# Patient Record
Sex: Female | Born: 1980 | Race: White | Hispanic: No | Marital: Married | Smoking: Never smoker
Health system: Southern US, Community
[De-identification: ages and names within clinical notes are randomized; demographics above are authoritative.]

## PROBLEM LIST (undated history)

## (undated) DIAGNOSIS — F419 Anxiety disorder, unspecified: Secondary | ICD-10-CM

## (undated) DIAGNOSIS — M069 Rheumatoid arthritis, unspecified: Secondary | ICD-10-CM

## (undated) DIAGNOSIS — R519 Headache, unspecified: Secondary | ICD-10-CM

## (undated) DIAGNOSIS — I10 Essential (primary) hypertension: Secondary | ICD-10-CM

## (undated) DIAGNOSIS — E041 Nontoxic single thyroid nodule: Secondary | ICD-10-CM

## (undated) HISTORY — PX: ABDOMINAL HYSTERECTOMY: SHX81

---

## 2001-07-11 HISTORY — PX: ADENOIDECTOMY: SUR15

## 2002-12-06 ENCOUNTER — Emergency Department (HOSPITAL_COMMUNITY): Admission: EM | Admit: 2002-12-06 | Discharge: 2002-12-06 | Payer: Self-pay | Admitting: Emergency Medicine

## 2002-12-06 ENCOUNTER — Encounter: Payer: Self-pay | Admitting: Emergency Medicine

## 2003-09-22 ENCOUNTER — Observation Stay (HOSPITAL_COMMUNITY): Admission: AD | Admit: 2003-09-22 | Discharge: 2003-09-22 | Payer: Self-pay | Admitting: Obstetrics and Gynecology

## 2003-09-27 ENCOUNTER — Inpatient Hospital Stay (HOSPITAL_COMMUNITY): Admission: AD | Admit: 2003-09-27 | Discharge: 2003-09-27 | Payer: Self-pay | Admitting: Obstetrics and Gynecology

## 2003-10-01 ENCOUNTER — Inpatient Hospital Stay (HOSPITAL_COMMUNITY): Admission: AD | Admit: 2003-10-01 | Discharge: 2003-10-01 | Payer: Self-pay | Admitting: Obstetrics and Gynecology

## 2003-10-08 ENCOUNTER — Inpatient Hospital Stay (HOSPITAL_COMMUNITY): Admission: AD | Admit: 2003-10-08 | Discharge: 2003-10-11 | Payer: Self-pay | Admitting: Obstetrics and Gynecology

## 2003-11-12 ENCOUNTER — Other Ambulatory Visit: Admission: RE | Admit: 2003-11-12 | Discharge: 2003-11-12 | Payer: Self-pay | Admitting: Obstetrics and Gynecology

## 2005-02-18 ENCOUNTER — Other Ambulatory Visit: Admission: RE | Admit: 2005-02-18 | Discharge: 2005-02-18 | Payer: Self-pay | Admitting: Obstetrics and Gynecology

## 2005-08-25 ENCOUNTER — Inpatient Hospital Stay (HOSPITAL_COMMUNITY): Admission: AD | Admit: 2005-08-25 | Discharge: 2005-08-25 | Payer: Self-pay | Admitting: Obstetrics and Gynecology

## 2005-10-10 ENCOUNTER — Inpatient Hospital Stay (HOSPITAL_COMMUNITY): Admission: AD | Admit: 2005-10-10 | Discharge: 2005-10-13 | Payer: Self-pay | Admitting: Obstetrics & Gynecology

## 2008-08-15 ENCOUNTER — Inpatient Hospital Stay (HOSPITAL_COMMUNITY): Admission: AD | Admit: 2008-08-15 | Discharge: 2008-08-15 | Payer: Self-pay | Admitting: Obstetrics and Gynecology

## 2008-08-21 ENCOUNTER — Ambulatory Visit: Payer: Self-pay | Admitting: Internal Medicine

## 2008-11-12 ENCOUNTER — Inpatient Hospital Stay (HOSPITAL_COMMUNITY): Admission: AD | Admit: 2008-11-12 | Discharge: 2008-11-12 | Payer: Self-pay | Admitting: Obstetrics and Gynecology

## 2008-11-12 ENCOUNTER — Inpatient Hospital Stay (HOSPITAL_COMMUNITY): Admission: AD | Admit: 2008-11-12 | Discharge: 2008-11-15 | Payer: Self-pay | Admitting: Obstetrics and Gynecology

## 2008-11-20 ENCOUNTER — Inpatient Hospital Stay (HOSPITAL_COMMUNITY): Admission: AD | Admit: 2008-11-20 | Discharge: 2008-11-20 | Payer: Self-pay | Admitting: Obstetrics and Gynecology

## 2008-11-25 LAB — CONVERTED CEMR LAB: Pap Smear: NORMAL

## 2009-10-01 ENCOUNTER — Ambulatory Visit: Payer: Self-pay | Admitting: Internal Medicine

## 2009-10-01 DIAGNOSIS — J36 Peritonsillar abscess: Secondary | ICD-10-CM | POA: Insufficient documentation

## 2009-10-01 DIAGNOSIS — Z8659 Personal history of other mental and behavioral disorders: Secondary | ICD-10-CM | POA: Insufficient documentation

## 2009-10-01 DIAGNOSIS — I10 Essential (primary) hypertension: Secondary | ICD-10-CM | POA: Insufficient documentation

## 2009-10-19 ENCOUNTER — Encounter: Payer: Self-pay | Admitting: Internal Medicine

## 2009-10-29 ENCOUNTER — Encounter: Payer: Self-pay | Admitting: Internal Medicine

## 2009-11-12 ENCOUNTER — Encounter: Payer: Self-pay | Admitting: Internal Medicine

## 2010-01-20 ENCOUNTER — Encounter: Payer: Self-pay | Admitting: Internal Medicine

## 2010-01-20 ENCOUNTER — Ambulatory Visit: Payer: Self-pay | Admitting: Family

## 2010-01-20 ENCOUNTER — Telehealth: Payer: Self-pay | Admitting: Family

## 2010-01-20 DIAGNOSIS — F341 Dysthymic disorder: Secondary | ICD-10-CM | POA: Insufficient documentation

## 2010-01-20 DIAGNOSIS — B359 Dermatophytosis, unspecified: Secondary | ICD-10-CM | POA: Insufficient documentation

## 2010-01-20 LAB — CONVERTED CEMR LAB
ALT: 15 units/L (ref 0–35)
AST: 17 units/L (ref 0–37)
Albumin: 4.6 g/dL (ref 3.5–5.2)
Alkaline Phosphatase: 63 units/L (ref 39–117)
BUN: 12 mg/dL (ref 6–23)
Bilirubin, Direct: 0.1 mg/dL (ref 0.0–0.3)
CO2: 24 meq/L (ref 19–32)
Calcium: 9.6 mg/dL (ref 8.4–10.5)
Chloride: 103 meq/L (ref 96–112)
Creatinine, Ser: 0.78 mg/dL (ref 0.40–1.20)
Glucose, Bld: 78 mg/dL (ref 70–99)
Indirect Bilirubin: 0.3 mg/dL (ref 0.0–0.9)
Potassium: 4 meq/L (ref 3.5–5.3)
Sodium: 139 meq/L (ref 135–145)
Total Bilirubin: 0.4 mg/dL (ref 0.3–1.2)
Total Protein: 7.6 g/dL (ref 6.0–8.3)

## 2010-01-26 ENCOUNTER — Telehealth: Payer: Self-pay | Admitting: Internal Medicine

## 2010-02-03 ENCOUNTER — Telehealth: Payer: Self-pay | Admitting: Internal Medicine

## 2010-02-17 ENCOUNTER — Ambulatory Visit: Payer: Self-pay | Admitting: Family

## 2010-02-17 LAB — CONVERTED CEMR LAB
ALT: 15 units/L (ref 0–35)
AST: 18 units/L (ref 0–37)
Albumin: 4.8 g/dL (ref 3.5–5.2)
Alkaline Phosphatase: 57 units/L (ref 39–117)
Bilirubin, Direct: 0.1 mg/dL (ref 0.0–0.3)
Indirect Bilirubin: 0.2 mg/dL (ref 0.0–0.9)
Total Bilirubin: 0.3 mg/dL (ref 0.3–1.2)
Total Protein: 7.3 g/dL (ref 6.0–8.3)

## 2010-03-08 ENCOUNTER — Encounter: Payer: Self-pay | Admitting: Family

## 2010-03-08 DIAGNOSIS — L708 Other acne: Secondary | ICD-10-CM | POA: Insufficient documentation

## 2010-03-08 DIAGNOSIS — B360 Pityriasis versicolor: Secondary | ICD-10-CM | POA: Insufficient documentation

## 2010-05-11 ENCOUNTER — Telehealth: Payer: Self-pay | Admitting: Family

## 2010-05-24 ENCOUNTER — Encounter: Payer: Self-pay | Admitting: Internal Medicine

## 2010-05-24 LAB — CONVERTED CEMR LAB: Pap Smear: NORMAL

## 2010-05-26 ENCOUNTER — Ambulatory Visit: Payer: Self-pay | Admitting: Family

## 2010-05-26 ENCOUNTER — Telehealth: Payer: Self-pay | Admitting: Family

## 2010-05-26 DIAGNOSIS — G43009 Migraine without aura, not intractable, without status migrainosus: Secondary | ICD-10-CM | POA: Insufficient documentation

## 2010-06-08 ENCOUNTER — Telehealth: Payer: Self-pay | Admitting: Family

## 2010-08-10 NOTE — Progress Notes (Signed)
Summary: CAN SHE COVER THE SORE ON HER ARM   Phone Note Call from Patient   Caller: Patient Call For: D. Thomos Lemons DO Summary of Call: CAN SHE COVER THE SORE ON HER ARM OR DOES SHE NEED TO LEAVE IT OPEN TO THE AIR CALL HER @ [phone] Initial call taken by: Roselle Locus,  January 20, 2010 3:13 PM  Follow-up for Phone Call        Notified pt per verbal instruction from [facility] that it is ok to cover her arm.  Nicki Guadalajara Fergerson CMA Duncan Dull)  January 20, 2010 4:13 PM

## 2010-08-10 NOTE — Progress Notes (Signed)
Summary: refill--Pristiq  Phone Note Refill Request Message from:  Patient on May 11, 2010 3:39 PM  Refills Requested: Medication #1:  PRISTIQ 50 MG XR24H-TAB one tablet by mouth daily   Dosage confirmed as above?Dosage Confirmed   Supply Requested: 1 month   Last Refilled: 04/22/2010 Pt request rx be [facility]. Pt notified.  Next Appointment Scheduled: 05-19-10 O'sullivan,NP Initial call taken by: Mervin Kung CMA Duncan Dull),  May 11, 2010 3:40 PM    Prescriptions: PRISTIQ 50 MG XR24H-TAB (DESVENLAFAXINE SUCCINATE) one tablet by mouth daily  #30 x 0   Entered by:   Mervin Kung CMA (AAMA)   Authorized by:   Lemont Fillers FNP   Signed by:   Mervin Kung CMA (AAMA) on 05/11/2010   Method used:   [facility]  [address] [phone]       [address] [place], [place]  [address]       Ph: [phone] or [phone]       Fax: [phone]   RxID:   [id number]

## 2010-08-10 NOTE — Consult Note (Signed)
Summary: [facility]  [facility]   Imported By: Lanelle Bal 11/20/2009 11:24:56  _____________________________________________________________________  External Attachment:    Type:   Image     Comment:   External Document

## 2010-08-10 NOTE — Progress Notes (Signed)
Summary: Terbenafine refill, LFT  Phone Note Call from Patient Call back at [phone]   Caller: Patient Call For: D. Thomos Lemons DO Summary of Call: Pt states that her ringworm seems to be getting better but is not completely healed.  She also has a new place that has come up. She is out of Terbenafine and would like a refill. Please advise.  Nicki Guadalajara Fergerson CMA Duncan Dull)  February 03, 2010 11:07 AM   Follow-up for Phone Call        ok to refill x 1. I rec she come in for blood tests to monitor her LFTs Follow-up by: D. Thomos Lemons DO,  February 03, 2010 12:18 PM  Additional Follow-up for Phone Call Additional follow up Details #1::        Pt advised that refill was approved and sent to pharmacy.  Pt has appt for LFTs in September. Does she need to have LFTs  before that time?  Nicki Guadalajara Fergerson CMA Duncan Dull)  February 03, 2010 1:06 PM     Additional Follow-up for Phone Call Additional follow up Details #2::    yes, come in earlier.  within 1-2 weeks Follow-up by: D. Thomos Lemons DO,  February 04, 2010 1:44 PM  Additional Follow-up for Phone Call Additional follow up Details #3:: Details for Additional Follow-up Action Taken: Pt notified per Dr. Olegario Messier instructions.  Lab appt set for 02/17/10 and order faxed to lab.  Nicki Guadalajara Fergerson CMA Duncan Dull)  February 04, 2010 3:29 PM   Prescriptions: TERBINAFINE HCL 250 MG TABS (TERBINAFINE HCL) one tablet by mouth daily x 2 weeks  #14 x 0   Entered by:   Mervin Kung CMA (AAMA)   Authorized by:   D. Thomos Lemons DO   Signed by:   Mervin Kung CMA (AAMA) on 02/03/2010   Method used:   Electronically to        [facility]  [phone]* (retail)       [address]       [place], [place]  [address]       Ph: [phone] or [phone]       Fax: [phone]   RxID:   [id number]

## 2010-08-10 NOTE — Assessment & Plan Note (Signed)
Summary: migraine/mhf--Rm 4   Vital Signs:  Patient profile:   30 year old female Height:      63 inches Weight:      215 pounds BMI:     38.22 Temp:     98.3 degrees F oral Pulse rate:   90 / minute Pulse rhythm:   regular Resp:     18 per minute BP sitting:   150 / 80  (right arm) Cuff size:   large  Vitals Entered By: Mervin Kung CMA Duncan Dull) (May 26, 2010 9:56 AM) CC: Pt states she has noticed increase in migraines. Mostly around menstrual cycle., Headache Is Patient Diabetic? No Pain Assessment Patient in pain? no      Comments Pt states she is still taking Pristiq and Xanax. Having to use Xanax more often due to "panic attacks".   Pt has stopped all other medications.  Nicki Guadalajara Fergerson CMA Duncan Dull)  May 26, 2010 10:06 AM    Primary Care Provider:  Dondra Spry DO  CC:  Pt states she has noticed increase in migraines. Mostly around menstrual cycle. and Headache.  History of Present Illness: Ms. Schrier is a 30 year old female who presents today for followup.  #1 depression/anxiety- initially her depression and anxiety were well controlled on her cecum Wellbutrin. The patient tells me that she stopped her Wellbutrin approximately 6 weeks ago. She has had an increase in her panic attacks since that time. She also tells me that she has a history of ADD, and finds it difficult to complete tasks at home. She does note that her panic attacks are relieved with use of Xanax however she uses this very sparingly. She has been followed by Dr. Betti Cruz in the past  #2 Headaches-the patient notes that she often develops headaches after her panic attacks. These headaches are accompanied by frontal discomfort. She notes that she'll also develops flashing lights and zigzags in her vision. The symptoms are relieved also by Xanax. She notes an increase in her headaches around the time of her ovulation and around time of her period. Ibuprofen does not help her headaches.  #3 blood  pressure-the patient tells me that she has been monitoring her blood pressure at home. She self discontinued the methyldopa. Reports blood pressure has been well-controlled and that she has "white coat hypertension."  Allergies: 1)  ! Amoxicillin  Past History:  Past Medical History: Last updated: 10/01/2009 Hypertension    Review of Systems       see HPI  Physical Exam  Head:  Normocephalic and atraumatic without obvious abnormalities. No apparent alopecia or balding. Eyes:  pupils equal round reactive to light and accommodation Lungs:  Normal respiratory effort, chest expands symmetrically. Lungs are clear to auscultation, no crackles or wheezes. Heart:  Normal rate and regular rhythm. S1 and S2 normal without gallop, murmur, click, rub or other extra sounds. Neurologic:  alert & oriented X3 and gait normal.   Psych:  pleasant but tearful female who is normally interactive with good eye contact   Impression & Recommendations:  Problem # 1:  DEPRESSION/ANXIETY (ICD-300.4) Assessment Deteriorated  Will refer patient to psychiatry for further evaluation of her anxiety depression and history of attention deficit disorder. I have instructed the patient to use Xanax as needed. She will be continued on Pritique and will defer further medication changes to psychiatry.  She wishes to see a new psychiatrist- previously followed by Dr. Betti Cruz.  25 minutes were spent with patient.  Greater than 50% of  this time was spent counseling patient on her anxiety and depression.    Orders: Psychiatric Referral (Psych)  Problem # 2:  HYPERTENSION (ICD-401.9) Assessment: Deteriorated Pt notes BP's have been stable at home.  Will have pt monitor and call us with the readings.  She is currently not taking methyldopa. The following medications were removed from the medication list:    Methyldopa 250 Mg Tabs (Methyldopa) ..... One by mouth once daily  Problem # 3:  MIGRAINE, COMMON  (ICD-346.10) Assessment: New  Trial of as needed imitrex.   Her updated medication list for this problem includes:    Sumatriptan Succinate 50 Mg Tabs (Sumatriptan succinate) ..... One tablet by mouth at start of headache.  may repeat in 2 hours if headache not relieved or if recurrent headache  Complete Medication List: 1)  Pristiq 50 Mg Xr24h-tab (Desvenlafaxine succinate) .... One tablet by mouth daily 2)  Dialyvite Vitamin D 5000 5000 Unit Caps (Cholecalciferol) .... Take one capsule by mouth every other day 3)  Xanax 0.5 Mg Tabs (Alprazolam) .... One tablet by mouth two times a day as needed 4)  Lotrisone 1-0.05 % Crea (Clotrimazole-betamethasone) .... Apply twice daily to affected areas 5)  Sumatriptan Succinate 50 Mg Tabs (Sumatriptan succinate) .... One tablet by mouth at start of headache.  may repeat in 2 hours if headache not relieved or if recurrent headache  Patient Instructions: 1)  You will be contacted about your referral to psychiatry.   Prescriptions: SUMATRIPTAN SUCCINATE 50 MG TABS (SUMATRIPTAN SUCCINATE) one tablet by mouth at start of headache.  May repeat in 2 hours if headache not relieved or if recurrent Headache  #6 x 0   Entered and Authorized by:   Lemont Fillers FNP   Signed by:   Lemont Fillers FNP on 05/26/2010   Method used:   [facility]  [address] [phone][facility] ([facility]ail[facility]       [address]       [place], [place]  [address]       Ph: [phone] or [phone]       Fax: [phone]   RxID:   [id number]    Orders Added: 1)  Psychiatric Referral [Psych] 2)  Est. Patient Level IV [[address]]    Current Allergies (reviewed today): ! AMOXICILLIN

## 2010-08-10 NOTE — Consult Note (Signed)
Summary: [facility]  [facility]   Imported By: Lanelle Bal 10/08/2009 13:18:53  _____________________________________________________________________  External Attachment:    Type:   Image     Comment:   External Document

## 2010-08-10 NOTE — Consult Note (Signed)
Summary: [facility]  [facility]   Imported By: Lanelle Bal 11/06/2009 09:30:54  _____________________________________________________________________  External Attachment:    Type:   Image     Comment:   External Document

## 2010-08-10 NOTE — Assessment & Plan Note (Signed)
Summary: 1 month follow up/mhf--Rm 4   Vital Signs:  Patient profile:   30 year old female Height:      63 inches Weight:      207.75 pounds BMI:     36.93 Temp:     98.4 degrees F oral Pulse rate:   84 / minute Pulse rhythm:   regular Resp:     16 per minute BP sitting:   120 / 68  (left arm) Cuff size:   large  Vitals Entered By: Mervin Kung CMA Duncan Dull) (February 17, 2010 2:49 PM) CC: rOOM 4   1 month follow up. Is Patient Diabetic? No Comments Pt has completed: magic mouthwas, cyanocobalamin, vit d, clindamycin and terbinafine.  Still has 1 week left on Lexapro 5mg  1 daily then will be completed. Eileen Foster CMA Duncan Dull)  February 17, 2010 2:55 PM    Primary Care Provider:  Dondra Spry DO  CC:  rOOM 4   1 month follow up.Marland Kitchen  History of Present Illness: Eileen Foster is a 30 year old female who presents today for follow up of her Depression.  She reports that he depression remains well controlled.  Last visit she was placed on Pristiq due to difficulty losing weight on Lexapro.  She continues the lexapro taper as instructed and notes that she feels well.  Denies Suicide ideation.    Ringworm-  resolved on forearm.  She has been on terbinafine x 1 month.  She has some additional lesions which have not improved despite terbinafine.     Allergies: 1)  ! 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 No apparent alopecia or balding. Lungs:  Normal respiratory effort, chest expands symmetrically. Lungs are clear to auscultation, no crackles or wheezes. Heart:  Normal rate and regular rhythm. S1 and S2 normal without gallop, murmur, click, rub or other extra sounds. Skin:  Resolution of lesion on right forearm.  Circular Lesion on right upper thigh remains unchanged.   Small circular esion on left anterior thigh, left breast.   Impression & Recommendations:  Problem # 1:  RINGWORM (ICD-110.9) Assessment Improved Large lesion has resolved, however smaller lesions remain unchanged.  Pt has been on terbinafine x 1 month.  Will D/C terbinafine and plan to refer patient to Dermatology for further evaluation.   Not clear if these lesions are ringworm or not. Patient will be given topical rx for lotrisone in the meantime.   Orders: Dermatology Referral (Derma)  Problem # 2:  DEPRESSION (ICD-311) Assessment: Improved Stable, continue Pristique, wellbutrin.   Her updated medication list for this problem includes:    Pristiq 50 Mg Xr24h-tab (Desvenlafaxine succinate) ..... One tablet by mouth daily    Wellbutrin Sr 150 Mg Xr12h-tab (Bupropion hcl) .Marland Kitchen... Take 1 tablet by mouth once a day    Xanax 0.25 Mg Tabs (Alprazolam) .Marland Kitchen... As needed for  anxiety  Complete Medication List: 1)  Pristiq 50 Mg Xr24h-tab (Desvenlafaxine succinate) .... One tablet by mouth daily 2)  Wellbutrin Sr 150 Mg Xr12h-tab (Bupropion hcl) .... Take 1 tablet by mouth once a day 3)  Dialyvite Vitamin D 5000 5000 Unit Caps (Cholecalciferol) .... Take one capsule by mouth every other day 4)  Methyldopa 250 Mg Tabs (Methyldopa) .... One by mouth once daily 5)  Xanax 0.25 Mg Tabs (Alprazolam) .... As needed for anxiety 6)  Lotrisone 1-0.05  % Crea (Clotrimazole-betamethasone) .... Apply twice daily to affected areas  Other Orders: TLB-Hepatic/Liver Function Pnl (80076-HEPATIC)  Patient Instructions: 1)  You will be contacted about your referral to dermatology. 2)  Complete your lab work downstairs. 3)  Please follow up with Dr. Artist Pais in 3 months. Prescriptions: LOTRISONE 1-0.05 % CREA (CLOTRIMAZOLE-BETAMETHASONE) apply twice daily to affected areas  #1 x 1   Entered and Authorized by:   Lemont Fillers FNP   Signed by:   Lemont Fillers FNP on 02/17/2010   Method used:   Electronically to        [facility]  [phone]* (retail)       [address]       [place], [place]  [address]       Ph: [phone] or [phone]       Fax: [phone]   RxID:   [id number]    Current Allergies (reviewed today): ! AMOXICILLIN

## 2010-08-10 NOTE — Progress Notes (Signed)
Summary: BP check  Phone Note Outgoing Call   Summary of Call: Please call patient and notify her that [facility] [place].  Also, please instruct her to check BP once daily for 1 week and contact us with the results.  Initial call taken by: Lemont Fillers FNP,  May 26, 2010 11:01 AM  Follow-up for Phone Call        Left message on machine to return my call. Nicki Guadalajara Fergerson CMA Duncan Dull)  May 26, 2010 12:00 PM   Additional Follow-up for Phone Call Additional follow up Details #1::        Pt advised and voices understanding. Nicki Guadalajara Fergerson CMA Duncan Dull)  May 26, 2010 12:09 PM

## 2010-08-10 NOTE — Progress Notes (Signed)
Summary: Follow up blood work  Phone Note [employer] of Call: Could you please arrange follow up lfts for this patient in 2 months? (v58.83) thanks Initial call taken by: Lemont Fillers FNP,  January 26, 2010 10:17 PM  Follow-up for Phone Call        attempted to contact patient at [phone], no answer. A detailed  voice message was left informing patient per [facility] instructions. She was advised to call back if any questions Follow-up by: Glendell Docker CMA,  January 27, 2010 9:30 AM

## 2010-08-10 NOTE — Consult Note (Signed)
Summary: [facility]  [facility]   Imported By: Lanelle Bal 03/12/2010 12:11:59  _____________________________________________________________________  External Attachment:    Type:   Image     Comment:   External Document

## 2010-08-10 NOTE — Consult Note (Signed)
Summary: [facility]  [facility]   Imported By: Lanelle Bal 10/28/2009 08:58:28  _____________________________________________________________________  External Attachment:    Type:   Image     Comment:   External Document

## 2010-08-10 NOTE — Miscellaneous (Signed)
  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

## 2010-08-10 NOTE — Assessment & Plan Note (Signed)
Summary: ringworm on arm/dt--Rm 3   Vital Signs:  Patient profile:   30 year old female Height:      63 inches Weight:      206.50 pounds BMI:     36.71 Temp:     97.9 degrees F oral Pulse rate:   84 / minute Pulse rhythm:   regular Resp:     16 per minute BP sitting:   120 / 68  (left arm) Cuff size:   large  Vitals Entered By: Mervin Kung CMA Duncan Dull) (January 20, 2010 2:30 PM) CC: Room 3  Pt states she has possible ringworm on right arm not relieved by OTC creams. Getting worse. Pt states Pristiq worked better in the past and would like to change from Lexapro to this. Is Patient Diabetic? No Comments Pt no longer taking:  Magic mouthwash, cyanocobalamin, vit d, clindamycin.     Primary Care Provider:  Dondra Spry DO  CC:  Room 3  Pt states she has possible ringworm on right arm not relieved by OTC creams. Getting worse. Pt states Pristiq worked better in the past and would like to change from Lexapro to this..  History of Present Illness: Ms Toothaker is a 30 year old female who presents today with two concerns:   1) Skin lesion- Pt reports  "ringworm" x 6 weeks on her right forearm.  She has tried lamisil otc with minimal relief.  Has also tried some home remedies without relief.  She now has another lesion on the top of her groin.  Denies itching.    2)Post partum Depression-  Patient has a 44 year old child (3rd child).  She did not have any issues with post partum depression following the birth of her first child.  After second child was born she was on the lexapro for one year prior to being switched to Pristiq.  She then took the Pristiq for 1 year.  She noted + panic attacks while on the Lexapro. less so on the pristiq.  Currently on Lexapro- still having panic attacks, about once a month. She is concerned about difficulty losing weight on Lexapro and the fact that she still continues to have panic attacks. She wishes to be switched to Pristiq.  Allergies: 1)  !  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 No apparent alopecia or balding. Skin:  2-3 inch wide annular lesion on the right forearm which is raised and erythematous, 1cm wide similar lesion at the top of her right leg.   Psych:  Cognition and judgment appear intact. Alert and cooperative with normal attention span and concentration. No apparent delusions, illusions, hallucinations   Impression & Recommendations:  Problem # 1:  DEPRESSION (ICD-311) Assessment Comment Only Will plan to start Pristiq and wean off of Lexapro as noted in pt instructions.  Pt instructed to call if she develops  worsening symptoms of depression on Pristiq (Go to ER if suicide ideation).   Her updated medication list for this problem includes:    Pristiq 50 Mg Xr24h-tab (Desvenlafaxine succinate) ..... One tablet by mouth daily    Wellbutrin Sr 150 Mg Xr12h-tab (Bupropion hcl) .Marland Kitchen... Take 1 tablet by mouth once a day    Xanax 0.25 Mg Tabs (Alprazolam) .Marland Kitchen... As needed for anxiety  Problem # 2:  RINGWORM (ICD-110.9) Assessment: New Has failed topical therapy.  Will start on by mouth Terbinafine. Check baseline LFT's/Cr Orders: TLB-BMP (Basic Metabolic  Panel-BMET) (80048-METABOL) TLB-Hepatic/Liver Function Pnl (80076-HEPATIC)  Complete Medication List: 1)  Magic Mouth Wash  .Marland Kitchen.. 10 cc swish and swallow or apit 2)  Pristiq 50 Mg Xr24h-tab (Desvenlafaxine succinate) .... One tablet by mouth daily 3)  Cyanocobalamin 1000 Mcg/ml Soln (Cyanocobalamin) .... Inject im once per month 4)  Wellbutrin Sr 150 Mg Xr12h-tab (Bupropion hcl) .... Take 1 tablet by mouth once a day 5)  Dialyvite Vitamin D 5000 5000 Unit Caps (Cholecalciferol) .... Take one capsule by mouth every other day 6)  Clindamycin Hcl 300 Mg Caps (Clindamycin hcl) .... One by mouth three times a day 7)  Methyldopa 250 Mg Tabs (Methyldopa) .... One by mouth once daily 8)  Xanax 0.25 Mg Tabs (Alprazolam) .... As needed for anxiety 9)  Terbinafine Hcl 250 Mg Tabs (Terbinafine hcl) .... One tablet by mouth daily x 2 weeks  Patient Instructions: 1)  Cut your lexapro in half 5mg  x 2 weeks. 2)  Then take lexapro 5mg  every other day for 2 weeks then stop. 3)  Please follow up with Dr. Artist Pais in 1 month- sooner if problems or concerns. Prescriptions: TERBINAFINE HCL 250 MG TABS (TERBINAFINE HCL) one tablet by mouth daily x 2 weeks  #14 x 0   Entered and Authorized by:   Lemont Fillers FNP   Signed by:   Lemont Fillers FNP on 01/20/2010   Method used:   Electronically to        [facility]  [phone]* (retail)       [address]       [place], [place]  [address]       Ph: [phone] or [phone]       Fax: [phone]   RxID:   [id number] PRISTIQ 50 MG XR24H-TAB (DESVENLAFAXINE SUCCINATE) one tablet by mouth daily  #30 x 1   Entered and Authorized by:   Lemont Fillers FNP   Signed by:   Lemont Fillers FNP on 01/20/2010   Method used:   Electronically to        [facility]  [phone]* (retail)       [address]       [place], [place]  [address]       Ph: [phone] or  [phone]       Fax: [phone]   RxID:   [id number]   Current Allergies (reviewed today): ! AMOXICILLIN

## 2010-08-10 NOTE — Progress Notes (Signed)
Summary: BP readings  ---- Converted from flag ---- ---- 06/08/2010 10:47 AM, Mervin Kung CMA (AAMA) wrote: Left message for pt to call me with BP readings.  ---- 06/07/2010 9:01 AM, Lemont Fillers FNP wrote: Please call patient to follow up on her blood pressure readings. ------------------------------  Phone Note Call from Patient Call back at [phone]   Caller: Patient Call For: Lemont Fillers FNP Summary of Call: Pt returned my call with BP readings. States they ranged from 120-130 / 70s. BP was 126/78 at OB last week. Nicki Guadalajara Fergerson CMA Duncan Dull)  June 08, 2010 2:35 PM   Follow-up for Phone Call        Good, she should follow up in 3 months please. Follow-up by: Lemont Fillers FNP,  June 09, 2010 8:16 AM  Additional Follow-up for Phone Call Additional follow up Details #1::        Pt scheduled f/u for 09/01/10 @ 3:30pm. Appt reminder has been mailed to pt. Nicki Guadalajara Fergerson CMA Duncan Dull)  June 09, 2010 9:05 AM

## 2010-08-10 NOTE — Assessment & Plan Note (Signed)
Summary: NEW SORE THROAT AND CONGESTION/MHF   Vital Signs:  Patient profile:   30 year old female Height:      63 inches Weight:      198 pounds BMI:     35.20 O2 Sat:      100 % Temp:     98.4 degrees F oral Pulse rate:   116 / minute Resp:     16 per minute BP sitting:   130 / 70  (right arm) Cuff size:   large  Vitals Entered By: Glendell Docker CMA (October 01, 2009 10:59 AM) CC: Rm 2- New Patient   Primary Care Provider:  Dondra Spry DO  CC:  Rm 2- New Patient.  History of Present Illness: 30 y/o white female to establish she c/o severe sore throat for past 2-3 days she was seen at urgent care and given rx for cephalexin  she notes episode of pharyngitis 2-3 wks ago.  she was given amoxicillin which caused stomach upset  no prev PCP she has been followed by her GYN BP issues during pregnancy.  she has been on labetalol in the past she has been able to gradually decrease dose of aldomet to current dose of 250 mg  she also has hx of post partum depression.    stable on lexapro and wellbutrin  Preventive Screening-Counseling & Management  Alcohol-Tobacco     Alcohol drinks/day: 0     Smoking Status: quit     Packs/Day: 0.5     Year Quit: 2004  Caffeine-Diet-Exercise     Caffeine use/day: 1 beverage daily     Does Patient Exercise: no  Allergies (verified): 1)  ! Amoxicillin  Past History:  Past Medical History: Hypertension    Family History: Family History of Arthritis Family History of CAD Female 1st degree relative <60 Family History Diabetes 1st degree relative Family History Hypertension    Social History: Occupation: Stay at [employer] Married 7 years  1 daughter 5 2 son 3, 1Smoking Status:  quit Packs/Day:  0.5 Caffeine use/day:  1 beverage daily Does Patient Exercise:  no  Review of Systems       The patient complains of weight gain.  The patient denies fever, chest pain, dyspnea on exertion, abdominal pain, melena, hematochezia,  severe indigestion/heartburn, and depression.    Physical Exam  General:  alert and overweight-appearing.   Head:  normocephalic and atraumatic.   Eyes:  pupils equal, pupils round, and pupils reactive to light.   Ears:  R ear normal and L ear normal.   Mouth:  pharyngeal erythema.  right palantine arch swollen and red Neck:  supple and no masses.   Lungs:  normal respiratory effort and normal breath sounds.   Heart:  normal rate, regular rhythm, and no gallop.   Abdomen:  soft, non-tender, normal bowel sounds, no hepatomegaly, and no splenomegaly.   Extremities:  No lower extremity edema  Neurologic:  cranial nerves II-XII intact and gait normal.   Skin:  warm, dry Psych:  normally interactive, good eye contact, not anxious appearing, and not depressed appearing.     Impression & Recommendations:  Problem # 1:  ABSCESS, PERITONSILLAR, RIGHT (ICD-475) 30 y/o white female with right peritonsillar abscess.  refer to ENT.  change abx to clindamycin  Orders: ENT Referral (ENT)  Problem # 2:  HYPERTENSION (ICD-401.9)  stable.  we discussed changing BP meds at next OV BP today: 130/70  Her updated medication list for this problem includes:  Methyldopa 250 Mg Tabs (Methyldopa) ..... One by mouth once daily  Problem # 3:  POSTPARTUM DEPRESSION, HX OF (ICD-V11.8) postpartum depression worse after 2 nd and 3 rd child.  she has been stable on lexapro and wellbutrin.  she would like to consider tapering off in the future  Complete Medication List: 1)  Magic Mouth Wash  .Marland Kitchen.. 10 cc swish and swallow or apit 2)  Lexapro 10 Mg Tabs (Escitalopram oxalate) .... Take 1 tablet by mouth once a day 3)  Cyanocobalamin 1000 Mcg/ml Soln (Cyanocobalamin) .... Inject im once per month 4)  Wellbutrin Sr 150 Mg Xr12h-tab (Bupropion hcl) .... Take 1 tablet by mouth once a day 5)  Dialyvite Vitamin D 5000 5000 Unit Caps (Cholecalciferol) .... Take one capsule by mouth every other day 6)  Clindamycin  Hcl 300 Mg Caps (Clindamycin hcl) .... One by mouth three times a day 7)  Methyldopa 250 Mg Tabs (Methyldopa) .... One by mouth once daily  Patient Instructions: 1)  Please schedule a follow-up appointment in 1 month. 2)  BMP prior to visit, ICD-9: 401.9 3)  Hepatic Panel prior to visit, ICD-9: 401.9 4)  Lipid Panel prior to visit, ICD-9: 401.9 5)  TSH prior to visit, ICD-9:  401.9 6)  CBC w/ Diff prior to visit, ICD-9: 401.9 7)  Please return for lab work one (1) week before your next appointment.  Prescriptions: CLINDAMYCIN HCL 300 MG CAPS (CLINDAMYCIN HCL) one by mouth three times a day  #30 x 0   Entered and Authorized by:   D. Thomos Lemons DO   Signed by:   D. Thomos Lemons DO on 10/01/2009   Method used:   Print then Give to Patient   RxID:   [id number]   Current Allergies (reviewed today): ! AMOXICILLIN   Preventive Care Screening  Pap Smear:    Date:  11/25/2008    Results:  normal

## 2010-08-26 ENCOUNTER — Ambulatory Visit (INDEPENDENT_AMBULATORY_CARE_PROVIDER_SITE_OTHER): Payer: BC Managed Care – PPO | Admitting: Family Medicine

## 2010-08-26 ENCOUNTER — Ambulatory Visit (HOSPITAL_BASED_OUTPATIENT_CLINIC_OR_DEPARTMENT_OTHER)
Admission: RE | Admit: 2010-08-26 | Discharge: 2010-08-26 | Disposition: A | Discharge status: Home / Self Care | Payer: BC Managed Care – PPO | Source: Ambulatory Visit | Attending: Family Medicine | Admitting: Family Medicine

## 2010-08-26 ENCOUNTER — Encounter: Payer: Self-pay | Admitting: Internal Medicine

## 2010-08-26 ENCOUNTER — Ambulatory Visit (INDEPENDENT_AMBULATORY_CARE_PROVIDER_SITE_OTHER): Payer: BC Managed Care – PPO | Admitting: Internal Medicine

## 2010-08-26 ENCOUNTER — Encounter: Payer: Self-pay | Admitting: Family Medicine

## 2010-08-26 ENCOUNTER — Other Ambulatory Visit: Payer: Self-pay | Admitting: Family Medicine

## 2010-08-26 DIAGNOSIS — M79673 Pain in unspecified foot: Secondary | ICD-10-CM

## 2010-08-26 DIAGNOSIS — M79609 Pain in unspecified limb: Secondary | ICD-10-CM | POA: Insufficient documentation

## 2010-08-26 LAB — CONVERTED CEMR LAB
Cholesterol, target level: 200 mg/dL
HDL goal, serum: 40 mg/dL
LDL Goal: 160 mg/dL

## 2010-09-01 ENCOUNTER — Ambulatory Visit: Payer: Self-pay | Admitting: Family

## 2010-09-01 NOTE — Assessment & Plan Note (Signed)
Summary: RIGHT FOOT/ DOI 08/22/10   Vital Signs:  Patient profile:   30 year old female Height:      63 inches (160.02 cm) Weight:      209 pounds (95.00 kg) BMI:     37.16 Temp:     98.0 degrees F (36.67 degrees C) oral Pulse rate:   80 / minute BP sitting:   140 / 80  Vitals Entered By: Kathi Simpers [facility]) (August 26, 2010 10:32 AM) CC: right foot / DOI 08-22-10 from Dr. Olegario Messier office Pain Assessment Patient in pain? yes     Location: rt. foot Intensity: 4 Nutritional Status BMI of > 30 = obese  Does patient need assistance? Functional Status Self care Ambulation Normal   Primary Care Provider:  Dondra Spry DO  CC:  right foot / DOI 08-22-10 from Dr. Olegario Messier office.  History of Present Illness: 30 yo F here for R foot pain  Patient reports injured her right foot on 2/12. She was folding laundry when saw her child was sitting up on corner about 2-3 feet from ground. Ran over quickly to get him down so he wouldn't fall Unsure how she hurt her foot because moved so quickly but developed pain and swelling after this more on bottom of foot. Now having trouble walking unless walking on her heel. No pain in ankle. No prior foot/ankle injuries.  Habits & Providers  Alcohol-Tobacco-Diet     Alcohol drinks/day: 0     Tobacco Status: never  Problems Prior to Update: 1)  Foot Pain, Right  (ICD-729.5) 2)  Migraine, Common  (ICD-346.10) 3)  Other Acne  (ICD-706.1) 4)  Pityriasis Versicolor  (ICD-111.0) 5)  Encounter For Therapeutic Drug Monitoring  (ICD-V58.83) 6)  Ringworm  (ICD-110.9) 7)  Depression/anxiety  (ICD-300.4) 8)  Abscess, Peritonsillar, Right  (ICD-475) 9)  Family History Diabetes 1st Degree Relative  (ICD-V18.0) 10)  Family History of Cad Female 1st Degree Relative <60  (ICD-V16.49) 11)  Hypertension  (ICD-401.9) 12)  Postpartum Depression, Hx of  (ICD-V11.8)  Medications Prior to Update: 1)  Pristiq 50 Mg Xr24h-Tab (Desvenlafaxine Succinate) .... One  Tablet By Mouth Daily 2)  Xanax 0.5 Mg Tabs (Alprazolam) .... One Tablet By Mouth Two Times A Day As Needed 3)  Focalin 2.5 Mg Tabs (Dexmethylphenidate Hcl) .... Take 1 Tablet By Mouth Once A Day  Allergies: 1)  ! Amoxicillin  Family History: Reviewed history from 10/01/2009 and no changes required. Family History of Arthritis Family History of CAD Female 1st degree relative <60 Family History Diabetes 1st degree relative Family History Hypertension    Social History: Reviewed history from 10/01/2009 and no changes required. Occupation: Stay at [employer] Married 7 years  1 daughter 5 2 son 3, 1Smoking Status:  never  Physical Exam  General:  Well-developed,well-nourished,in no acute distress; alert,appropriate and cooperative throughout examination Msk:  R ankle/foot: Mild bruising/swelling into 2nd-3rd toes. TTP 2nd-4th MT heads into MTP joints.  No ankle, base 5th, navicular TTP.  No other TTP about foot. FROM ankle without pain. Able to flex and extend at IP joints of toes but with pain in 2nd-4th. No deformity of plantar fascia or TTP within plantar fascia Negative talar tilt and ant drawer. NVI distally with 2+ dp pulses.   Impression & Recommendations:  Problem # 1:  FOOT PAIN, RIGHT (ICD-729.5) Assessment Unchanged Standing x-rays performed without evidence of fracture or lis franc injury (no tenderness at this joint however). Consistent with foot sprain.  Postop  shoe provided, icing, nsaids, elevation when possible.  See instructions for further.  Orders: Diagnostic X-Ray/Fluoroscopy (Diagnostic X-Ray/Flu) Post-op Shoe (L3260)  Complete Medication List: 1)  Pristiq 50 Mg Xr24h-tab (Desvenlafaxine succinate) .... One tablet by mouth daily 2)  Xanax 0.5 Mg Tabs (Alprazolam) .... One tablet by mouth two times a day as needed 3)  Focalin 2.5 Mg Tabs (Dexmethylphenidate hcl) .... Take 1 tablet by mouth once a day  Patient Instructions: 1)  Your x-rays are negative  for a fracture or severe ligament tear. 2)  You have sprained your foot. 3)  Treatment for this is a hard soled shoe to prevent flexing of your forefoot which irritates the area. 4)  Ice for 15 minutes at a time 3-4 times a day. 5)  Take aleve 1-2 tabs twice a day with food for pain and inflammation - take regularly for 1 week then as needed beyond that. 6)  Elevate above the level of your heart for swelling as much as possible. 7)  Follow up with me in 2-3 weeks to ensure you are progressing as expected.   Orders Added: 1)  Diagnostic X-Ray/Fluoroscopy [Diagnostic X-Ray/Flu] 2)  New Patient Level III [99203] 3)  Post-op Shoe [L3260]

## 2010-09-16 ENCOUNTER — Ambulatory Visit: Payer: BC Managed Care – PPO | Admitting: Family Medicine

## 2010-09-16 NOTE — Assessment & Plan Note (Signed)
Summary: hurt her foot/mhf   Vital Signs:  Patient profile:   30 year old female Height:      63 inches Weight:      215.13 pounds BMI:     38.25 O2 Sat:      100 % on Room air Temp:     98.0 degrees F oral Pulse rate:   113 / minute Resp:     18 per minute BP sitting:   142 / 80  (left arm) Cuff size:   large  Vitals Entered By: Glendell Docker CMA (August 26, 2010 9:37 AM)  O2 Flow:  Room air CC: Right foot injury, Lipid Management Pain Assessment Patient in pain? yes     Location: foot Intensity: 4 Type: Throbbing Onset of pain  Constant Comments fell Sunday on her foot, downstairs at home, trying to catch her son from falling onn the fall, her right foot is swollen on the bottom, constant increase in  pain with  pressure   Primary Care Provider:  Dondra Spry DO  CC:  Right foot injury and Lipid Management.  History of Present Illness: sunday -  folding laundry pt was trying to catch her son falling from kitchen counter   pt not sure how she injured her foot bruising bottom of foot is swollen pain worse with weight bearing  no prev injury   Lipid Management History:      Positive NCEP/ATP III risk factors include hypertension.  Negative NCEP/ATP III risk factors include female age less than 52 years old and non-tobacco-user status.     Preventive Screening-Counseling & Management  Alcohol-Tobacco     Smoking Status: never  Allergies: 1)  ! Amoxicillin  Social History: Smoking Status:  never  Physical Exam  General:  Well-developed,well-nourished,in no acute distress; alert,appropriate and cooperative throughout examination Lungs:  Normal respiratory effort, chest expands symmetrically. Lungs are clear to auscultation, no crackles or wheezes. Heart:  Normal rate and regular rhythm. S1 and S2 normal without gallop, murmur, click, rub or other extra sounds. Msk:  mild swelling and bruising   Impression & Recommendations:  Problem # 1:  FOOT  PAIN, RIGHT (ICD-729.5) x ray negative for fracture.  probable sprain  refer to Dr. Pearletha Forge for boot   Orders: T-Foot Right (73630TC)  Complete Medication List: 1)  Pristiq 50 Mg Xr24h-tab (Desvenlafaxine succinate) .... One tablet by mouth daily 2)  Xanax 0.5 Mg Tabs (Alprazolam) .... One tablet by mouth two times a day as needed 3)  Focalin 2.5 Mg Tabs (Dexmethylphenidate hcl) .... Take 1 tablet by mouth once a day  Lipid Assessment/Plan:      Based on NCEP/ATP III, the patient's risk factor category is "0-1 risk factors".  The patient's lipid goals are as follows: Total cholesterol goal is 200; LDL cholesterol goal is 160; HDL cholesterol goal is 40; Triglyceride goal is 150.     Orders Added: 1)  T-Foot Right [73630TC] 2)  Est. Patient Level III [72536]   Immunization History:  Influenza Immunization History:    Influenza:  historical (04/20/2010)   Immunization History:  Influenza Immunization History:    Influenza:  Historical (04/20/2010)  Current Allergies (reviewed today): ! AMOXICILLIN   Preventive Care Screening  Pap Smear:    Date:  05/24/2010    Results:  normal

## 2010-10-19 LAB — CBC
HCT: 28.8 % — ABNORMAL LOW (ref 36.0–46.0)
HCT: 31 % — ABNORMAL LOW (ref 36.0–46.0)
HCT: 34 % — ABNORMAL LOW (ref 36.0–46.0)
Hemoglobin: 10.7 g/dL — ABNORMAL LOW (ref 12.0–15.0)
Hemoglobin: 11.7 g/dL — ABNORMAL LOW (ref 12.0–15.0)
Hemoglobin: 9.9 g/dL — ABNORMAL LOW (ref 12.0–15.0)
MCHC: 34.4 g/dL (ref 30.0–36.0)
MCHC: 34.4 g/dL (ref 30.0–36.0)
MCHC: 34.5 g/dL (ref 30.0–36.0)
MCV: 81.2 fL (ref 78.0–100.0)
MCV: 81.5 fL (ref 78.0–100.0)
MCV: 82.1 fL (ref 78.0–100.0)
Platelets: 171 10*3/uL (ref 150–400)
Platelets: 205 10*3/uL (ref 150–400)
Platelets: 301 10*3/uL (ref 150–400)
RBC: 3.51 MIL/uL — ABNORMAL LOW (ref 3.87–5.11)
RBC: 3.82 MIL/uL — ABNORMAL LOW (ref 3.87–5.11)
RBC: 4.17 MIL/uL (ref 3.87–5.11)
RDW: 14.7 % (ref 11.5–15.5)
RDW: 14.8 % (ref 11.5–15.5)
RDW: 15.1 % (ref 11.5–15.5)
WBC: 8.3 10*3/uL (ref 4.0–10.5)
WBC: 9.3 10*3/uL (ref 4.0–10.5)
WBC: 9.6 10*3/uL (ref 4.0–10.5)

## 2010-10-19 LAB — COMPREHENSIVE METABOLIC PANEL
ALT: 33 U/L (ref 0–35)
AST: 23 U/L (ref 0–37)
Albumin: 2.8 g/dL — ABNORMAL LOW (ref 3.5–5.2)
Alkaline Phosphatase: 120 U/L — ABNORMAL HIGH (ref 39–117)
BUN: 9 mg/dL (ref 6–23)
CO2: 25 mEq/L (ref 19–32)
Calcium: 9.1 mg/dL (ref 8.4–10.5)
Chloride: 106 mEq/L (ref 96–112)
Creatinine, Ser: 0.72 mg/dL (ref 0.4–1.2)
GFR calc Af Amer: >60 mL/min (ref >60)
GFR calc non Af Amer: >60 mL/min (ref >60)
Glucose, Bld: 85 mg/dL (ref 70–99)
Potassium: 4.3 mEq/L (ref 3.5–5.1)
Sodium: 140 mEq/L (ref 135–145)
Total Bilirubin: 0.3 mg/dL (ref 0.3–1.2)
Total Protein: 5.9 g/dL — ABNORMAL LOW (ref 6.0–8.3)

## 2010-10-19 LAB — URIC ACID: Uric Acid, Serum: 7.8 mg/dL — ABNORMAL HIGH (ref 2.4–7.0)

## 2010-10-19 LAB — LACTATE DEHYDROGENASE: LDH: 253 U/L — ABNORMAL HIGH (ref 94–250)

## 2010-10-19 LAB — RPR: RPR Ser Ql: NONREACTIVE

## 2011-07-28 ENCOUNTER — Telehealth: Payer: Self-pay | Admitting: Family

## 2011-07-28 NOTE — Telephone Encounter (Signed)
SHE WAS SEEING BARBARA MORGAN.  SHE WAS GIVING HER THE FOCALIN.  SHE MISSED AN APPOINTMENT BECAUSE OF A SICK CHILD.  THE PERSON AT DR [facility] OFFICE WAS VERY RUDE TO HER AND HAD HER VERY UPSET.  SHE HAD SPOKEN TO MELISSA PREVIOUSLY ABOUT DOING HER ADD MEDS.  WOULD MELISSA WRITE THIS RX AND GET THE PRIOR AUTH WITH HER INSURANCE FOR HER ON THIS MEDICINE.  SHE WILL BE GLAD TO COME IN TOMORROW TO SEE MELISSA IF NECESSARY

## 2011-07-28 NOTE — Telephone Encounter (Signed)
She will need office visit first please.

## 2011-07-28 NOTE — Telephone Encounter (Signed)
Notified pt and scheduled appt for tomorrow at 2:30.

## 2011-07-28 NOTE — Telephone Encounter (Signed)
Please advise 

## 2011-07-29 ENCOUNTER — Telehealth: Payer: Self-pay | Admitting: *Deleted

## 2011-07-29 ENCOUNTER — Encounter: Payer: Self-pay | Admitting: Family

## 2011-07-29 ENCOUNTER — Ambulatory Visit (INDEPENDENT_AMBULATORY_CARE_PROVIDER_SITE_OTHER): Payer: BC Managed Care – PPO | Admitting: Family

## 2011-07-29 DIAGNOSIS — F341 Dysthymic disorder: Secondary | ICD-10-CM

## 2011-07-29 DIAGNOSIS — F988 Other specified behavioral and emotional disorders with onset usually occurring in childhood and adolescence: Secondary | ICD-10-CM | POA: Insufficient documentation

## 2011-07-29 DIAGNOSIS — I10 Essential (primary) hypertension: Secondary | ICD-10-CM

## 2011-07-29 MED ORDER — HYDROCHLOROTHIAZIDE 25 MG PO TABS: 25.0000 mg | ORAL_TABLET | Freq: Every day | ORAL | Status: DC

## 2011-07-29 NOTE — Patient Instructions (Signed)
Please follow up in 1 month.  

## 2011-07-29 NOTE — Assessment & Plan Note (Signed)
Stable, continue pristiq.  

## 2011-07-29 NOTE — Progress Notes (Signed)
  Subjective:    Patient ID: Eileen Foster, female    DOB: 15-May-1981, 31 y.o.   MRN: [id number]  HPI  Ms.  Foster is a 31 yr old female who presents today for follow up.  1) ADD- has taken focalin and pristiq.  She has been seeing psychiatry- most recently has been seeing Shelly Coss.   [facility].  Focalin 2.5mg  3 tabs am and 2 tabs in PM.  Needs prior auth.    2) HTN- She reports that her BP is not that high at home.   3) Depression/anxiety- Mood is good, anxiety bothers her around the time of her period.     Review of Systems See HPI  No past medical history on file.  History   Social History  . Marital Status: Married    Spouse Name: N/A    Number of Children: N/A  . Years of Education: N/A   Occupational History  . Not on file.   Social History Main Topics  . Smoking status: Never Smoker   . Smokeless tobacco: Never Used  . Alcohol Use: Not on file  . Drug Use: Not on file  . Sexually Active: Not on file   Other Topics Concern  . Not on file   Social History Narrative  . No narrative on file    No past surgical history on file.  No family history on file.  Allergies  Allergen Reactions  . Amoxicillin     REACTION: Upset Stomach    No current outpatient prescriptions on file prior to visit.    BP 146/74  Pulse 84  Temp(Src) 98.4 F (36.9 C) (Oral)  Resp 16  Wt 199 lb 1.9 oz (90.32 kg)       Objective:   Physical Exam  Constitutional: She appears well-developed and well-nourished. No distress.  HENT:  Head: Normocephalic and atraumatic.  Cardiovascular: Normal rate and regular rhythm.   No murmur heard. Pulmonary/Chest: Effort normal and breath sounds normal. No respiratory distress. She has no wheezes. She has no rales. She exhibits no tenderness.  Musculoskeletal: She exhibits no edema.  Psychiatric: She has a normal mood and affect. Her behavior is normal. Judgment and thought content normal.            Assessment & Plan:   BP Readings from Last 3 Encounters:  07/29/11 146/74  08/26/10 142/80  08/26/10 140/80

## 2011-07-29 NOTE — Assessment & Plan Note (Signed)
Deteriorated.  Start HCTZ, follow up in 1 month.

## 2011-07-29 NOTE — Telephone Encounter (Signed)
Spoke to [place]ard at [employer] [phone] and obtained approval for Gen. Focalin 2.5mg  until 07/28/2012. No quantity limits. Notified pt and she will fill current Rx. Future refills can be obtained through [place] per Sandford Craze, NP.

## 2011-07-29 NOTE — Telephone Encounter (Signed)
Please call pt's insurance for Focalin prior auth.

## 2011-07-29 NOTE — Assessment & Plan Note (Signed)
She has been following with Shelly Coss MD-psychiatry.  Well controlled.  Will try to obtain prior auth from insurance and will also request records from psychiatry.

## 2011-08-29 ENCOUNTER — Ambulatory Visit: Payer: BC Managed Care – PPO | Admitting: Family

## 2011-09-02 ENCOUNTER — Ambulatory Visit: Payer: BC Managed Care – PPO | Admitting: Family

## 2011-09-02 ENCOUNTER — Encounter: Payer: Self-pay | Admitting: Family

## 2011-09-02 ENCOUNTER — Ambulatory Visit (INDEPENDENT_AMBULATORY_CARE_PROVIDER_SITE_OTHER): Payer: BC Managed Care – PPO | Admitting: Family

## 2011-09-02 DIAGNOSIS — I10 Essential (primary) hypertension: Secondary | ICD-10-CM

## 2011-09-02 DIAGNOSIS — F988 Other specified behavioral and emotional disorders with onset usually occurring in childhood and adolescence: Secondary | ICD-10-CM

## 2011-09-02 MED ORDER — DEXMETHYLPHENIDATE HCL 2.5 MG PO TABS: ORAL_TABLET | ORAL | Status: DC

## 2011-09-02 NOTE — Assessment & Plan Note (Signed)
BP looks good today.  I have asked her to check BP once a week, call if >140/90.  Otherwise, I think it is reasonable for her to remain off of BP meds.

## 2011-09-02 NOTE — Assessment & Plan Note (Signed)
Focalin refill provided.  Further refills to be provided by her new psychiatrist.

## 2011-09-02 NOTE — Patient Instructions (Signed)
Please check BP once a week at home.  Call if BP is >140/90.   Follow up in 3 months.

## 2011-09-02 NOTE — Progress Notes (Signed)
  Subjective:    Patient ID: Eileen Foster, female    DOB: Jun 24, 1981, 31 y.o.   MRN: [id number]  HPI  HTN-  Last visit BP was elevated.  She reports that she has white coat hypertension.  Filled HCTZ, but never started. Reports that her BP was 126/76 at home this morning.    ADHD- she is scheduled to establish with a new psychiatrist the second week in March and is requesting a refill on her focalin until this visit.  Review of Systems See HPI  No past medical history on file.  History   Social History  . Marital Status: Married    Spouse Name: N/A    Number of Children: N/A  . Years of Education: N/A   Occupational History  . Not on file.   Social History Main Topics  . Smoking status: Never Smoker   . Smokeless tobacco: Never Used  . Alcohol Use: Not on file  . Drug Use: Not on file  . Sexually Active: Not on file   Other Topics Concern  . Not on file   Social History Narrative  . No narrative on file    No past surgical history on file.  No family history on file.  Allergies  Allergen Reactions  . Amoxicillin     REACTION: Upset Stomach    Current Outpatient Prescriptions on File Prior to Visit  Medication Sig Dispense Refill  . ALPRAZolam (XANAX) 0.5 MG tablet Take as needed for panic attack.      Marland Kitchen PRISTIQ 50 MG 24 hr tablet Take 1 tablet by mouth daily.      . hydrochlorothiazide (HYDRODIURIL) 25 MG tablet Take 1 tablet (25 mg total) by mouth daily.  30 tablet  2    BP 130/82  Pulse 114  Temp(Src) 98 F (36.7 C) (Oral)  Resp 16  Wt 200 lb (90.719 kg)  SpO2 99%       Objective:   Physical Exam  Constitutional: She appears well-developed and well-nourished.  Cardiovascular: Normal rate and regular rhythm.   No murmur heard. Pulmonary/Chest: Effort normal and breath sounds normal. No respiratory distress. She has no wheezes. She has no rales. She exhibits no tenderness.  Musculoskeletal: She exhibits no edema.  Psychiatric: She has a normal  mood and affect. Her behavior is normal. Judgment and thought content normal.          Assessment & Plan:   BP Readings from Last 3 Encounters:  09/02/11 130/82  07/29/11 146/74  08/26/10 142/80

## 2011-10-06 ENCOUNTER — Telehealth: Payer: Self-pay | Admitting: Family

## 2011-10-06 MED ORDER — DEXMETHYLPHENIDATE HCL 2.5 MG PO TABS: ORAL_TABLET | ORAL | Status: DC

## 2011-10-06 NOTE — Telephone Encounter (Signed)
Spoke with pt, she states she has had to r/s psychiatry visit until 10/26/11. She will run out of Focalin this weekend and is requesting Rx. Rx printed and forwarded to Provider. Dr Rodena Medin, can you authorize in Melissa's absence?

## 2011-10-06 NOTE — Telephone Encounter (Signed)
Rx signed, pt notified. Rx has been left at ER registration desk for pt to pick up tomorrow. Pt may have husband pick up rx. Pt also wanted to let Melissa know that she will be seeing Judithe Modest on 10/26/11.

## 2011-10-06 NOTE — Telephone Encounter (Signed)
Patient is requesting a new prescription of focalin

## 2011-10-26 ENCOUNTER — Ambulatory Visit (INDEPENDENT_AMBULATORY_CARE_PROVIDER_SITE_OTHER): Payer: BC Managed Care – PPO | Admitting: Licensed Clinical Social Worker

## 2011-10-26 DIAGNOSIS — F909 Attention-deficit hyperactivity disorder, unspecified type: Secondary | ICD-10-CM

## 2011-11-15 ENCOUNTER — Telehealth: Payer: Self-pay | Admitting: *Deleted

## 2011-11-15 MED ORDER — DEXMETHYLPHENIDATE HCL 2.5 MG PO TABS: ORAL_TABLET | ORAL | Status: DC

## 2011-11-15 NOTE — Telephone Encounter (Signed)
Pt left message requesting refill of Focalin. States she is awaiting formal ADD testing at the end of this month. Last refill sent 10/06/11. Rx printed and forwarded to Provider for signature.

## 2011-11-15 NOTE — Telephone Encounter (Signed)
Signed.

## 2011-11-15 NOTE — Telephone Encounter (Signed)
Notified pt that rx is at front desk for pick up.

## 2011-11-30 ENCOUNTER — Ambulatory Visit: Payer: BC Managed Care – PPO | Admitting: Family

## 2011-12-02 ENCOUNTER — Ambulatory Visit (INDEPENDENT_AMBULATORY_CARE_PROVIDER_SITE_OTHER): Payer: BC Managed Care – PPO | Admitting: Family

## 2011-12-02 ENCOUNTER — Encounter: Payer: Self-pay | Admitting: Family

## 2011-12-02 VITALS — BP 126/78 | HR 83 | Temp 98.3°F | Resp 16 | Ht 62.99 in | Wt 201.1 lb

## 2011-12-02 DIAGNOSIS — F988 Other specified behavioral and emotional disorders with onset usually occurring in childhood and adolescence: Secondary | ICD-10-CM

## 2011-12-02 DIAGNOSIS — E669 Obesity, unspecified: Secondary | ICD-10-CM

## 2011-12-02 DIAGNOSIS — I10 Essential (primary) hypertension: Secondary | ICD-10-CM

## 2011-12-02 DIAGNOSIS — F341 Dysthymic disorder: Secondary | ICD-10-CM

## 2011-12-02 IMAGING — CR DG FOOT COMPLETE 3+V*R*
1 series · 1 of 1 positions shown · non-contrast
Comparison: None.

CLINICAL DATA: Injured foot several days ago with persistent pain

RIGHT FOOT COMPLETE - 3+ VIEW

[view not recorded]
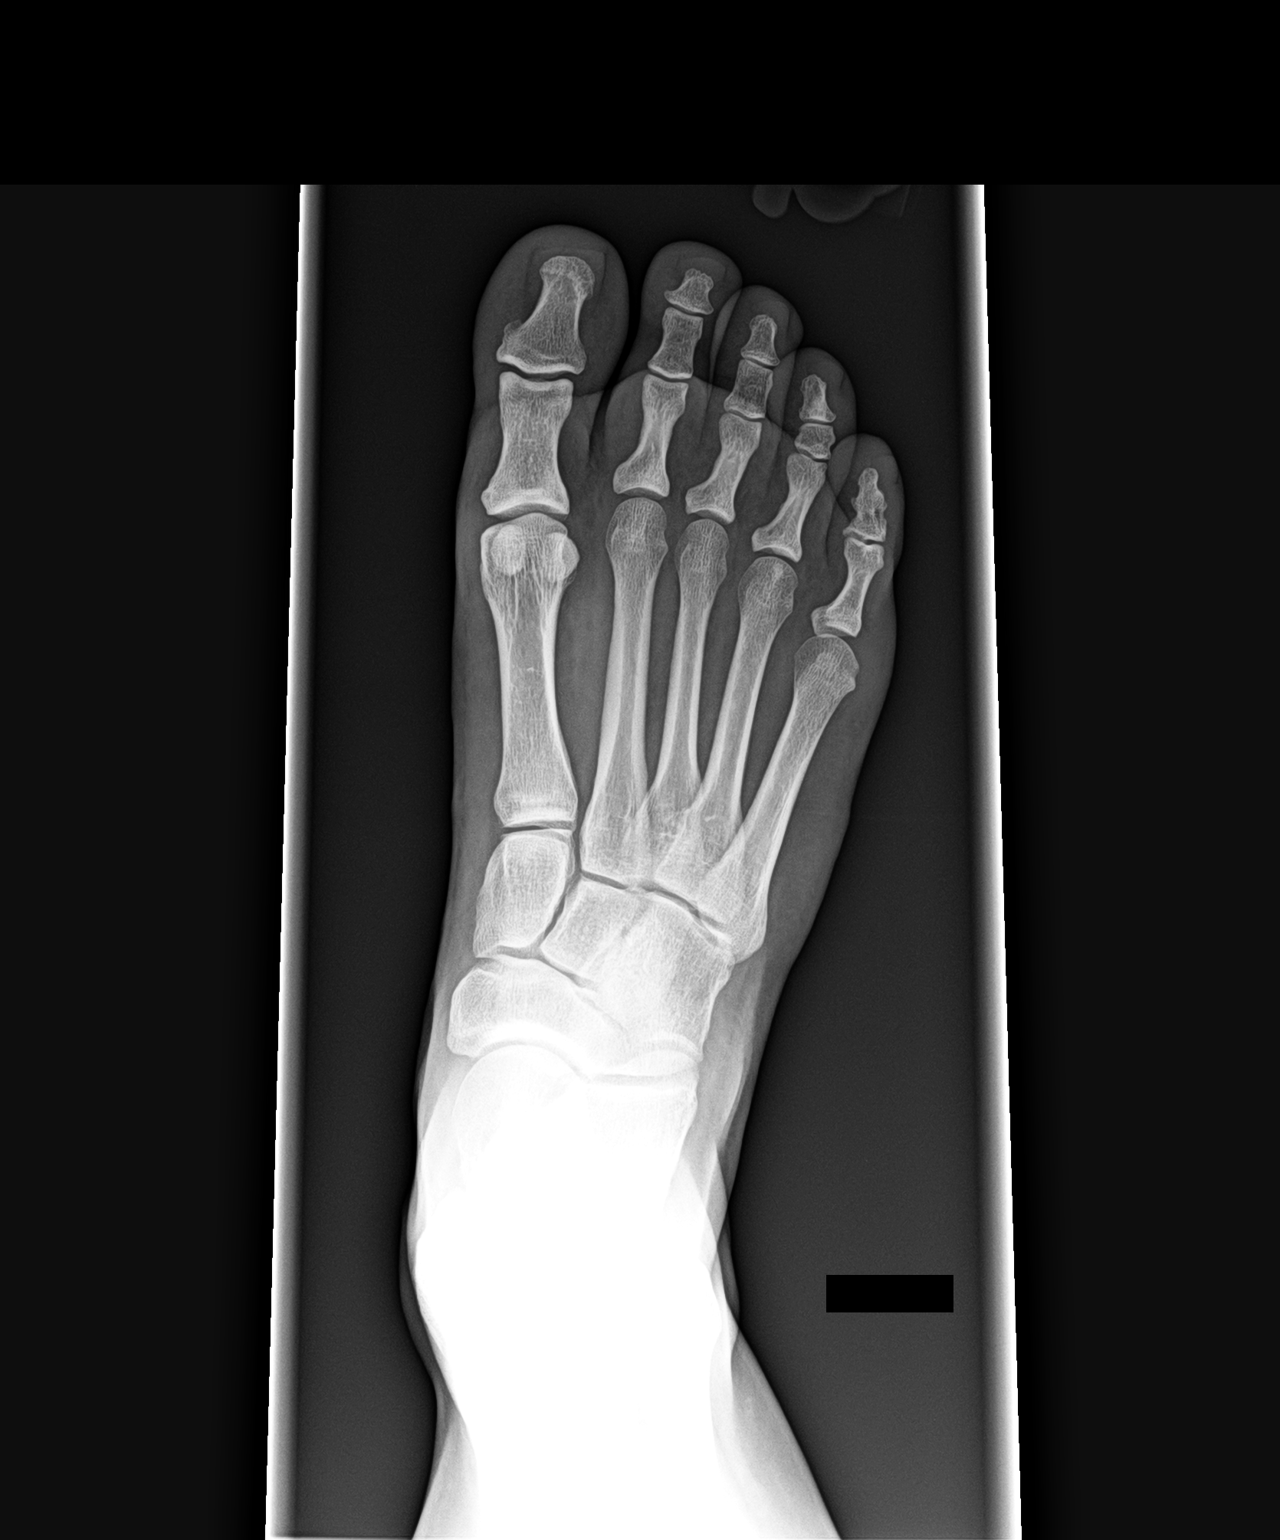

[1 of 1 positions shown; findings below may reference images not displayed]

FINDINGS: Tarsal - metatarsal alignment is normal.  No acute
fracture is seen.  Joint spaces appear normal.
IMPRESSION: Negative right foot.

## 2011-12-02 NOTE — Patient Instructions (Addendum)
You will be contact about your referral to the nutritionist. Please let us know if you have not heard back within 1 week about your referral. Please follow up in 3 months, sooner if problems/concerns.

## 2011-12-02 NOTE — Progress Notes (Signed)
  Subjective:    Patient ID: Eileen Foster, female    DOB: 05-09-1981, 31 y.o.   MRN: [id number]  HPI  Ms.  Foster is a 31 yr old female who presents today for follow up.  1)HTN- tries to watch her sodium.  Has been more active.  Not currently on BP meds.  2) ADD- she saw therapist.  Feels good on current dose of focalin.  3) overweight-  Reports that she has not been counting calories.  Wants to lose weight.    4) Depression-Reports mood is good on pristique.  On xanax prn- only generally needs during her cycle.      Review of Systems See HPI  No past medical history on file.  History   Social History  . Marital Status: Married    Spouse Name: N/A    Number of Children: N/A  . Years of Education: N/A   Occupational History  . Not on file.   Social History Main Topics  . Smoking status: Never Smoker   . Smokeless tobacco: Never Used  . Alcohol Use: Not on file  . Drug Use: Not on file  . Sexually Active: Not on file   Other Topics Concern  . Not on file   Social History Narrative  . No narrative on file    No past surgical history on file.  No family history on file.  Allergies  Allergen Reactions  . Amoxicillin     REACTION: Upset Stomach    Current Outpatient Prescriptions on File Prior to Visit  Medication Sig Dispense Refill  . ALPRAZolam (XANAX) 0.5 MG tablet Take as needed for panic attack.      Marland Kitchen dexmethylphenidate (FOCALIN) 2.5 MG tablet Take 3 tablets in the morning and 2 tablets in the afternoon  150 tablet  0  . PRISTIQ 50 MG 24 hr tablet Take 1 tablet by mouth daily.        BP 126/78  Pulse 83  Temp(Src) 98.3 F (36.8 C) (Oral)  Resp 16  Ht 5' 2.99" (1.6 m)  Wt 201 lb 1.3 oz (91.209 kg)  BMI 35.63 kg/m2  SpO2 98%       Objective:   Physical Exam  Constitutional: She is oriented to person, place, and time. She appears well-developed and well-nourished. No distress.  HENT:  Head: Normocephalic and atraumatic.  Cardiovascular:  Normal rate and regular rhythm.   No murmur heard. Pulmonary/Chest: Effort normal and breath sounds normal. No respiratory distress. She has no wheezes. She has no rales. She exhibits no tenderness.  Abdominal: Soft. Bowel sounds are normal. She exhibits no distension and no mass. There is no tenderness. There is no rebound and no guarding.  Neurological: She is alert and oriented to person, place, and time.  Skin: Skin is warm and dry.  Psychiatric: She has a normal mood and affect. Her behavior is normal. Judgment and thought content normal.          Assessment & Plan:

## 2011-12-06 NOTE — Assessment & Plan Note (Signed)
Stable on pristiq.  Continue same.  

## 2011-12-06 NOTE — Assessment & Plan Note (Signed)
Stable on current dose of focalin. Continue same.

## 2011-12-06 NOTE — Assessment & Plan Note (Signed)
BP Readings from Last 3 Encounters:  12/02/11 126/78  09/02/11 130/82  07/29/11 146/74   BP stable with low sodium diet.  Monitor.

## 2011-12-22 ENCOUNTER — Telehealth: Payer: Self-pay | Admitting: Family

## 2011-12-22 MED ORDER — DEXMETHYLPHENIDATE HCL 2.5 MG PO TABS: ORAL_TABLET | ORAL | Status: DC

## 2011-12-22 NOTE — Telephone Encounter (Signed)
Ok to print

## 2011-12-22 NOTE — Telephone Encounter (Signed)
Call placed to patient at [phone],she was informed of Rx approval, and Rx would be left at front desk for patient pick up.

## 2011-12-22 NOTE — Telephone Encounter (Signed)
Pt is requesting a refill of Focalin. She state that she will be going on vacation tomorrow and would like to know if it would be ready by tomorrow. I did inform her that Efraim Kaufmann was out of the office until Monday.

## 2011-12-23 NOTE — Telephone Encounter (Signed)
Noted  

## 2011-12-23 NOTE — Telephone Encounter (Signed)
PATIENT PICKED UP RX 

## 2012-01-02 ENCOUNTER — Ambulatory Visit: Payer: BC Managed Care – PPO | Admitting: *Deleted

## 2012-01-16 ENCOUNTER — Other Ambulatory Visit: Payer: Self-pay | Admitting: *Deleted

## 2012-01-16 MED ORDER — DESVENLAFAXINE SUCCINATE ER 50 MG PO TB24: 50.0000 mg | ORAL_TABLET | Freq: Every day | ORAL | Status: DC

## 2012-01-16 NOTE — Telephone Encounter (Signed)
Notified Pam in the pharmacy.

## 2012-01-16 NOTE — Telephone Encounter (Signed)
Received call from pharmacy requesting refill of pt's pristiq. Spoke to pt, she states we have refilled this for her before as well as her therapist. Pt is waiting in the pharmacy. Please advise if ok to refill and if so how many refills may we give.

## 2012-01-16 NOTE — Telephone Encounter (Signed)
Refill sent.

## 2012-01-23 ENCOUNTER — Telehealth: Payer: Self-pay | Admitting: *Deleted

## 2012-01-23 MED ORDER — DEXMETHYLPHENIDATE HCL 2.5 MG PO TABS: ORAL_TABLET | ORAL | Status: DC

## 2012-01-23 NOTE — Telephone Encounter (Signed)
Spoke with patient and informed her that prescription was ready to be picked up.

## 2012-01-23 NOTE — Telephone Encounter (Signed)
Received voice message from pt requesting refill of Focalin. Rx last printed on 12/23/11. Pt requests call when rx is ready for pick up. Rx printed and forwarded to Provider for review.

## 2012-02-03 ENCOUNTER — Telehealth: Payer: Self-pay | Admitting: *Deleted

## 2012-02-03 MED ORDER — ALPRAZOLAM 0.5 MG PO TABS: ORAL_TABLET | ORAL | Status: DC

## 2012-02-03 NOTE — Telephone Encounter (Signed)
Received call from pt requesting refill of alprazolam that she takes as needed. Current Rx has expired. Attempted to reach pt to verify pharmacy and received voicemail. Left detailed message on pt's home # that rx [facility] and to call on Monday if she has any questions.

## 2012-03-01 ENCOUNTER — Telehealth: Payer: Self-pay | Admitting: Family

## 2012-03-01 ENCOUNTER — Other Ambulatory Visit: Payer: Self-pay | Admitting: Internal Medicine

## 2012-03-01 MED ORDER — DEXMETHYLPHENIDATE HCL 2.5 MG PO TABS: ORAL_TABLET | ORAL | Status: DC

## 2012-03-01 NOTE — Telephone Encounter (Signed)
Not sure of drug. Is this ok to refill?... 03/01/12@11 :18am/LMB

## 2012-03-01 NOTE — Telephone Encounter (Signed)
Ok to print

## 2012-03-01 NOTE — Telephone Encounter (Signed)
Pt informed Rx requested ready for P/U Mon-Fri 8a-5p/SLS

## 2012-03-01 NOTE — Telephone Encounter (Signed)
Patient is requesting a new prescription for Focalin

## 2012-04-19 ENCOUNTER — Other Ambulatory Visit: Payer: Self-pay | Admitting: *Deleted

## 2012-04-19 MED ORDER — DEXMETHYLPHENIDATE HCL 2.5 MG PO TABS: ORAL_TABLET | ORAL | Status: DC

## 2012-04-19 NOTE — Telephone Encounter (Signed)
Pt called requesting refill of Focalin. Advised her Rx will be ready for pick up tomorrow and that she was due for f/u in August. Transferred pt to front office to schedule f/u. Rx last printed on 03/01/12. Current rx printed and forwarded to Provider for signature.

## 2012-04-20 NOTE — Telephone Encounter (Signed)
Signed.

## 2012-04-20 NOTE — Telephone Encounter (Signed)
Notified pt that rx has been placed at front desk for pick up. 

## 2012-05-01 ENCOUNTER — Ambulatory Visit: Payer: BC Managed Care – PPO | Admitting: Family

## 2012-05-08 ENCOUNTER — Ambulatory Visit (INDEPENDENT_AMBULATORY_CARE_PROVIDER_SITE_OTHER): Payer: BC Managed Care – PPO | Admitting: Family

## 2012-05-08 ENCOUNTER — Encounter: Payer: Self-pay | Admitting: Family

## 2012-05-08 VITALS — BP 130/88 | HR 95 | Temp 98.2°F | Resp 16 | Ht 62.0 in | Wt 203.0 lb

## 2012-05-08 DIAGNOSIS — F341 Dysthymic disorder: Secondary | ICD-10-CM

## 2012-05-08 DIAGNOSIS — Z23 Encounter for immunization: Secondary | ICD-10-CM

## 2012-05-08 DIAGNOSIS — I10 Essential (primary) hypertension: Secondary | ICD-10-CM

## 2012-05-08 DIAGNOSIS — F988 Other specified behavioral and emotional disorders with onset usually occurring in childhood and adolescence: Secondary | ICD-10-CM

## 2012-05-08 NOTE — Assessment & Plan Note (Signed)
Stable on focalin. Continue same.  

## 2012-05-08 NOTE — Assessment & Plan Note (Signed)
Stable with diet control

## 2012-05-08 NOTE — Patient Instructions (Addendum)
Please follow up in 6 months for a fasting physical.  

## 2012-05-08 NOTE — Assessment & Plan Note (Signed)
Stable on pristiq.  Continue same.  

## 2012-05-08 NOTE — Progress Notes (Signed)
  Subjective:    Patient ID: Eileen Foster, female    DOB: 11-Dec-1980, 31 y.o.   MRN: [id number]  HPI  Eileen Foster is a 31 yr old female who presents today for follow up.  1) Depression- she continues on pristiq. Reports that her anxiety "peaks" right before menstrual cycle.  She is rarely using xanax.  1-2 times a month only.   2) HTN- This is managed with low sodium diet.  3) ADD- she continues focalin. Reports that she feels her dose is not working as well as it used to.    Review of Systems See HPI  No past medical history on file.  History   Social History  . Marital Status: Married    Spouse Name: N/A    Number of Children: N/A  . Years of Education: N/A   Occupational History  . Not on file.   Social History Main Topics  . Smoking status: Never Smoker   . Smokeless tobacco: Never Used  . Alcohol Use: Not on file  . Drug Use: Not on file  . Sexually Active: Not on file   Other Topics Concern  . Not on file   Social History Narrative  . No narrative on file    No past surgical history on file.  No family history on file.  Allergies  Allergen Reactions  . Amoxicillin     REACTION: Upset Stomach    Current Outpatient Prescriptions on File Prior to Visit  Medication Sig Dispense Refill  . ALPRAZolam (XANAX) 0.5 MG tablet Take as needed for panic attack.  30 tablet  0  . desvenlafaxine (PRISTIQ) 50 MG 24 hr tablet Take 1 tablet (50 mg total) by mouth daily.  30 tablet  2  . dexmethylphenidate (FOCALIN) 2.5 MG tablet Take 3 tablets in the morning and 2 tablets in the afternoon  150 tablet  0    BP 130/88  Pulse 95  Temp 98.2 F (36.8 C) (Oral)  Resp 16  Ht 5\' 2"  (1.575 m)  Wt 203 lb (92.08 kg)  BMI 37.13 kg/m2  SpO2 99%  LMP 04/24/2012       Objective:   Physical Exam  Constitutional: She appears well-developed and well-nourished. No distress.  HENT:  Head: Normocephalic and atraumatic.  Right Ear: Tympanic membrane and ear canal normal.    Left Ear: Tympanic membrane and ear canal normal.  Cardiovascular: Normal rate and regular rhythm.   No murmur heard. Pulmonary/Chest: Effort normal and breath sounds normal. No respiratory distress. She has no wheezes. She has no rales. She exhibits no tenderness.  Psychiatric: She has a normal mood and affect. Her behavior is normal. Judgment and thought content normal.          Assessment & Plan:

## 2012-05-23 ENCOUNTER — Telehealth: Payer: Self-pay | Admitting: *Deleted

## 2012-05-23 MED ORDER — DEXMETHYLPHENIDATE HCL 2.5 MG PO TABS: ORAL_TABLET | ORAL | Status: DC

## 2012-05-23 NOTE — Telephone Encounter (Signed)
Rx placed at front desk and pt notified. 

## 2012-05-23 NOTE — Telephone Encounter (Signed)
Signed.

## 2012-05-23 NOTE — Telephone Encounter (Signed)
Pt left message requesting written rx of Focalin. Last rx printed on 04/19/12, #150 x no refills. Pt due for follow up in April 2014. Rx printed and forwarded to Provider for signature.

## 2012-06-26 ENCOUNTER — Telehealth: Payer: Self-pay | Admitting: *Deleted

## 2012-06-26 MED ORDER — DEXMETHYLPHENIDATE HCL 2.5 MG PO TABS: ORAL_TABLET | ORAL | Status: DC

## 2012-06-26 NOTE — Telephone Encounter (Signed)
Pt left message on 06/25/12 requesting refill of Focalin. Rx last printed on 05/23/12. Rx printed for #150 x no refills and forwarded to Provider for signature.

## 2012-06-26 NOTE — Telephone Encounter (Signed)
Signed.

## 2012-06-26 NOTE — Telephone Encounter (Signed)
Notified pt and placed rx at front desk for pick up. 

## 2012-07-27 ENCOUNTER — Other Ambulatory Visit: Payer: Self-pay | Admitting: *Deleted

## 2012-07-27 MED ORDER — DEXMETHYLPHENIDATE HCL 2.5 MG PO TABS: ORAL_TABLET | ORAL | Status: DC

## 2012-07-27 NOTE — Telephone Encounter (Signed)
Notified pt that rx has been placed at the front desk and is ready for pick up.

## 2012-07-27 NOTE — Telephone Encounter (Signed)
Pt left message requesting refill of Focalin. Rx last printed on 06/26/12. Pt due for f/u 10/31/12. Rx printed and forwarded to Provider for signature.

## 2012-08-15 ENCOUNTER — Other Ambulatory Visit: Payer: Self-pay | Admitting: Family

## 2012-08-15 NOTE — Telephone Encounter (Signed)
Alprazolam 0.5mg  1 tablet daily as needed for panic attack, #30 x [facility].

## 2012-08-27 ENCOUNTER — Telehealth: Payer: Self-pay | Admitting: *Deleted

## 2012-08-27 MED ORDER — DEXMETHYLPHENIDATE HCL 2.5 MG PO TABS: ORAL_TABLET | ORAL | Status: DC

## 2012-08-27 NOTE — Telephone Encounter (Signed)
Received message from pt requesting refill of Focalin. Rx last printed 07/27/12. Pt has f/u 10/31/12. Printed rx and forwarded to [facility] for signature.

## 2012-08-28 NOTE — Telephone Encounter (Signed)
Rx placed at front desk for pick up and pt notified. 

## 2012-08-28 NOTE — Telephone Encounter (Signed)
Signed.

## 2012-08-29 ENCOUNTER — Telehealth: Payer: Self-pay | Admitting: *Deleted

## 2012-08-29 NOTE — Telephone Encounter (Signed)
PA completed and approved from 08/08/12 through 08/29/13, ref# [id number]. Notified pharmacy and pt.

## 2012-08-29 NOTE — Telephone Encounter (Signed)
PA obtained for Dexmethylphenidate 5mg , po #150 tabs with dates of 08/08/2012-2/19/215. Case reference number [id number]. [facility] pharmacy notified and given ref number.

## 2012-08-29 NOTE — Telephone Encounter (Signed)
Received fax from [facility] pharmacy to complete PA for Focalin. Unable to reach pt to verify previous medications tried. [facility] [place].

## 2012-09-26 ENCOUNTER — Telehealth: Payer: Self-pay | Admitting: *Deleted

## 2012-09-26 MED ORDER — DEXMETHYLPHENIDATE HCL 2.5 MG PO TABS: ORAL_TABLET | ORAL | Status: DC

## 2012-09-26 NOTE — Telephone Encounter (Signed)
Received message from pt requesting refill of Focalin. Rx last printed on 08/27/12, #150. Pt has f/u in April. Rx printed and forwarded to Provider for signature.

## 2012-09-27 ENCOUNTER — Telehealth: Payer: Self-pay | Admitting: *Deleted

## 2012-09-27 MED ORDER — DEXMETHYLPHENIDATE HCL 10 MG PO TABS: ORAL_TABLET | ORAL | Status: DC

## 2012-09-27 NOTE — Telephone Encounter (Signed)
Rx placed at front desk for pick up, pt notified

## 2012-09-27 NOTE — Telephone Encounter (Signed)
Spoke with Marcelino Duster at [facility] [phone] and received authorization good from 09/06/12 through 09/27/13 for Rx below. Case # [id number]. Notified Pam at pharmacy. Pt aware.

## 2012-09-27 NOTE — Telephone Encounter (Signed)
Signed.

## 2012-09-27 NOTE — Telephone Encounter (Signed)
Received call from [place] at [facility] Focalin is on manufacturer backorder. Possible alternative would be methylphenidate (Ritalin) and will require prior auth.  Alternatively he states pt may get Focalin 10mg  and take 3/4 tablet every morning and 1/2 tablet every afternoon.  Please advise.

## 2012-09-27 NOTE — Telephone Encounter (Signed)
Lets send Focalin 10mg  and take 3/4 tablet every morning and 1/2 tablet every afternoon #37 tabs.

## 2012-10-25 ENCOUNTER — Telehealth: Payer: Self-pay | Admitting: *Deleted

## 2012-10-25 NOTE — Telephone Encounter (Signed)
Pt request Focalin refill; informed pt will not be available until Mon. 04.21.14, d/t Holiday on 04.18.14 [facility] provider not in office], patient understood & agreed/SLS

## 2012-10-29 MED ORDER — DEXMETHYLPHENIDATE HCL 10 MG PO TABS: ORAL_TABLET | ORAL | Status: DC

## 2012-10-29 NOTE — Telephone Encounter (Signed)
See Rx 

## 2012-10-29 NOTE — Telephone Encounter (Signed)
Rx placed at front desk for pick up and pt notified. 

## 2012-10-31 ENCOUNTER — Ambulatory Visit: Payer: BC Managed Care – PPO | Admitting: Family

## 2012-11-06 ENCOUNTER — Ambulatory Visit: Payer: BC Managed Care – PPO | Admitting: Family

## 2012-11-06 DIAGNOSIS — Z0289 Encounter for other administrative examinations: Secondary | ICD-10-CM

## 2012-11-28 ENCOUNTER — Telehealth: Payer: Self-pay | Admitting: *Deleted

## 2012-11-28 MED ORDER — DEXMETHYLPHENIDATE HCL 10 MG PO TABS: ORAL_TABLET | ORAL | Status: DC

## 2012-11-28 NOTE — Telephone Encounter (Signed)
See rx. 

## 2012-11-28 NOTE — Telephone Encounter (Signed)
Rx placed at front desk and pt notified. 

## 2012-11-28 NOTE — Telephone Encounter (Signed)
Received message from pt requesting refill of Focalin. Last rx printed 10/29/12, #38 x no refills. Pt was due for a fasting physical in April and cancelled appt. Will advise pt of need for appt. Rx printed and forwarded to Provider for signature.

## 2013-01-01 ENCOUNTER — Telehealth: Payer: Self-pay | Admitting: *Deleted

## 2013-01-01 MED ORDER — DEXMETHYLPHENIDATE HCL 10 MG PO TABS: ORAL_TABLET | ORAL | Status: DC

## 2013-01-01 NOTE — Telephone Encounter (Signed)
Rx printed. Pt will need to rescheduled cpx please.

## 2013-01-01 NOTE — Telephone Encounter (Signed)
Received message from pt requesting written rx of focalin. Last rx printed 11/28/12. Pt was due for a physical in April and has not scheduled appt yet. Please advise.

## 2013-01-01 NOTE — Telephone Encounter (Signed)
Notified pt and she states she will call back in the morning to r/s cpe. Rx placed at front desk for pick up.

## 2013-01-30 ENCOUNTER — Telehealth: Payer: Self-pay | Admitting: Family

## 2013-01-30 MED ORDER — DEXMETHYLPHENIDATE HCL 10 MG PO TABS: ORAL_TABLET | ORAL | Status: DC

## 2013-01-30 NOTE — Telephone Encounter (Signed)
Patient is requesting a new prescription of Focalin. She would like to pick up prescription today or tomorrow because she is going out of town tomorrow night.

## 2013-01-30 NOTE — Telephone Encounter (Signed)
Rx printed on 01/01/13, #38 x no refills. Rx printed and forwarded to Provider for signature.

## 2013-01-31 NOTE — Telephone Encounter (Signed)
Rx placed at front desk and pt notified. 

## 2013-02-12 ENCOUNTER — Ambulatory Visit (INDEPENDENT_AMBULATORY_CARE_PROVIDER_SITE_OTHER): Payer: BC Managed Care – PPO | Admitting: Family

## 2013-02-12 ENCOUNTER — Encounter: Payer: Self-pay | Admitting: Family

## 2013-02-12 VITALS — BP 116/76 | HR 93 | Temp 97.8°F | Resp 16 | Wt 202.0 lb

## 2013-02-12 DIAGNOSIS — H6121 Impacted cerumen, right ear: Secondary | ICD-10-CM

## 2013-02-12 DIAGNOSIS — H6120 Impacted cerumen, unspecified ear: Secondary | ICD-10-CM

## 2013-02-12 DIAGNOSIS — J019 Acute sinusitis, unspecified: Secondary | ICD-10-CM

## 2013-02-12 MED ORDER — CEFUROXIME AXETIL 500 MG PO TABS: 500.0000 mg | ORAL_TABLET | Freq: Two times a day (BID) | ORAL | Status: DC

## 2013-02-12 NOTE — Patient Instructions (Addendum)
Please complete your lab work prior to leaving. Call if symptoms worsen or if not improved in 2-3 days.

## 2013-02-12 NOTE — Assessment & Plan Note (Signed)
Suspect right maxillary sinusitis with early T TM. Will rx with ceftin. Reports intolerant to amoxicillin.

## 2013-02-12 NOTE — Assessment & Plan Note (Signed)
Ceruminosis is noted.  Wax is removed by syringing and manual debridement. Instructions for home care to prevent wax buildup are given.  

## 2013-02-12 NOTE — Progress Notes (Signed)
  Subjective:    Patient ID: Eileen Foster, female    DOB: [birth year], 32 y.o.   MRN: [id number]  HPI  Eileen Foster is a 32 yr old female who presents today with chief complaint of otalgia. Pain is present in the right ear > left ear.  Pain started following a head cold in June.  She reports pain was intermittent since that time.  Has been worsening x 1 week.  Trouble hearing. No improvement in pain with "sweet oil" to the left ear.    She also reports pain/pressure in the right cheek and behind the right eye.   Review of Systems See HPI  No past medical history on file.  History   Social History  . Marital Status: Married    Spouse Name: N/A    Number of Children: N/A  . Years of Education: N/A   Occupational History  . Not on file.   Social History Main Topics  . Smoking status: Never Smoker   . Smokeless tobacco: Never Used  . Alcohol Use: Not on file  . Drug Use: Not on file  . Sexually Active: Not on file   Other Topics Concern  . Not on file   Social History Narrative  . No narrative on file    No past surgical history on file.  No family history on file.  Allergies  Allergen Reactions  . Amoxicillin     REACTION: Upset Stomach    Current Outpatient Prescriptions on File Prior to Visit  Medication Sig Dispense Refill  . ALPRAZolam (XANAX) 0.5 MG tablet TAKE 1 TABLET BY MOUTH EVERY DAY AS NEEDED  30 tablet  0  . desvenlafaxine (PRISTIQ) 50 MG 24 hr tablet Take 1 tablet (50 mg total) by mouth daily.  30 tablet  2  . dexmethylphenidate (FOCALIN) 10 MG tablet take 3/4 tablet every morning and 1/2 tablet every afternoon.  38 tablet  0   No current facility-administered medications on file prior to visit.    BP 116/76  Pulse 93  Temp(Src) 97.8 F (36.6 C) (Oral)  Resp 16  Wt 202 lb (91.627 kg)  BMI 36.94 kg/m2  SpO2 99%       Objective:   Physical Exam  Constitutional: She is oriented to person, place, and time. She appears well-developed and  well-nourished. No distress.  HENT:  Head: Normocephalic and atraumatic.  Mouth/Throat: No oropharyngeal exudate, posterior oropharyngeal edema or posterior oropharyngeal erythema.  R ear TM occluded by cerumen. After removal of cerumen retracted TM without erythema is noted.   L TM normal  + tenderness to palpation of the right sinus  Cardiovascular: Normal rate and regular rhythm.   No murmur heard. Pulmonary/Chest: Effort normal and breath sounds normal. No respiratory distress. She has no wheezes. She has no rales. She exhibits no tenderness.  Musculoskeletal: She exhibits no edema.  Neurological: She is alert and oriented to person, place, and time.  Psychiatric: She has a normal mood and affect. Her behavior is normal. Judgment and thought content normal.          Assessment & Plan:

## 2013-02-22 ENCOUNTER — Ambulatory Visit: Payer: BC Managed Care – PPO | Admitting: Family

## 2013-02-22 DIAGNOSIS — Z0289 Encounter for other administrative examinations: Secondary | ICD-10-CM

## 2013-03-05 ENCOUNTER — Other Ambulatory Visit: Payer: Self-pay | Admitting: *Deleted

## 2013-03-05 MED ORDER — DEXMETHYLPHENIDATE HCL 10 MG PO TABS: ORAL_TABLET | ORAL | Status: DC

## 2013-03-05 NOTE — Telephone Encounter (Signed)
Received message from pt requesting Rx of focalin.  Last rx printed 01/30/13, #38.  Please advise.

## 2013-03-05 NOTE — Telephone Encounter (Signed)
See rx. 

## 2013-03-06 NOTE — Telephone Encounter (Signed)
Pt has been notified.

## 2013-03-06 NOTE — Telephone Encounter (Signed)
Rx placed at front desk for pick up. Attempted to notify pt and was unable to leave message on voicemail.

## 2013-04-05 ENCOUNTER — Telehealth: Payer: Self-pay | Admitting: *Deleted

## 2013-04-05 MED ORDER — DEXMETHYLPHENIDATE HCL 10 MG PO TABS: ORAL_TABLET | ORAL | Status: DC

## 2013-04-05 NOTE — Addendum Note (Signed)
Addended by: Sandford Craze on: 04/05/2013 02:07 PM   Modules accepted: Orders

## 2013-04-05 NOTE — Telephone Encounter (Signed)
Pt left message requesting rx of focalin. Last rx printed 03/05/13, #38. Pt last seen in August for acute infection. Last ADD follow up was 04/2012.  Please advise.

## 2013-04-09 NOTE — Telephone Encounter (Signed)
Rx was printed and place at front desk. Pt picked Rx up on 04/08/13.

## 2013-05-08 ENCOUNTER — Telehealth: Payer: Self-pay | Admitting: *Deleted

## 2013-05-08 MED ORDER — DEXMETHYLPHENIDATE HCL 10 MG PO TABS: ORAL_TABLET | ORAL | Status: DC

## 2013-05-08 NOTE — Telephone Encounter (Signed)
Rx signed.

## 2013-05-08 NOTE — Telephone Encounter (Signed)
Rx placed at front desk for pick up and pt notified. 

## 2013-05-08 NOTE — Telephone Encounter (Signed)
Received call from pt requesting refill of Focalin. Rx last printed 04/05/13. Rx printed and forwarded to Provider for signature. Pt was last seen for acute illness in August but has no future follow ups scheduled. When should pt have next follow up?

## 2013-05-09 ENCOUNTER — Ambulatory Visit: Payer: BC Managed Care – PPO

## 2013-05-16 ENCOUNTER — Other Ambulatory Visit: Payer: Self-pay

## 2013-06-12 ENCOUNTER — Telehealth: Payer: Self-pay | Admitting: *Deleted

## 2013-06-12 NOTE — Telephone Encounter (Signed)
Notified pt. She is scheduled for follow up on 06/14/13 and is aware Rx will be addressed at that time.

## 2013-06-12 NOTE — Telephone Encounter (Signed)
Pt left message requesting rx for focalin.  Last rx provided on 05/08/13. Pt no showed 05/09/13 appt and has no Future appts on file. Last visit was for acute illness in August. Please advise.

## 2013-06-12 NOTE — Telephone Encounter (Signed)
Needs OV prior to additional refills.  

## 2013-06-14 ENCOUNTER — Ambulatory Visit (INDEPENDENT_AMBULATORY_CARE_PROVIDER_SITE_OTHER): Payer: BC Managed Care – PPO | Admitting: Family

## 2013-06-14 ENCOUNTER — Encounter: Payer: Self-pay | Admitting: Family

## 2013-06-14 VITALS — BP 130/70 | HR 90 | Temp 97.9°F | Resp 16 | Ht 62.0 in | Wt 209.1 lb

## 2013-06-14 DIAGNOSIS — F341 Dysthymic disorder: Secondary | ICD-10-CM

## 2013-06-14 DIAGNOSIS — E010 Iodine-deficiency related diffuse (endemic) goiter: Secondary | ICD-10-CM

## 2013-06-14 DIAGNOSIS — Z23 Encounter for immunization: Secondary | ICD-10-CM

## 2013-06-14 DIAGNOSIS — I10 Essential (primary) hypertension: Secondary | ICD-10-CM

## 2013-06-14 DIAGNOSIS — E049 Nontoxic goiter, unspecified: Secondary | ICD-10-CM | POA: Insufficient documentation

## 2013-06-14 DIAGNOSIS — F988 Other specified behavioral and emotional disorders with onset usually occurring in childhood and adolescence: Secondary | ICD-10-CM

## 2013-06-14 MED ORDER — DEXMETHYLPHENIDATE HCL 10 MG PO TABS: ORAL_TABLET | ORAL | Status: DC

## 2013-06-14 NOTE — Patient Instructions (Signed)
Please schedule thyroid ultrasound on the first floor. Return for lab work when you complete ultrasound. Follow up in 6 months.

## 2013-06-14 NOTE — Assessment & Plan Note (Signed)
Stable with diet control. Monitor.

## 2013-06-14 NOTE — Progress Notes (Signed)
Pre visit review using our clinic review tool, if applicable. No additional management support is needed unless otherwise documented below in the visit note. 

## 2013-06-14 NOTE — Progress Notes (Signed)
   Subjective:    Patient ID: Eileen Foster, female    DOB: Jan 08, 1981, 32 y.o.   MRN: [id number]  HPI  Eileen Foster is a 32 yr old female who presents today for follow up of multiple medical problems.  1) Depression/anxiety- She discontinued pristiq since September.  She tapered herself down.  Reports mood is good.  Notes very rare anxiety symptoms.   2) HTN-  BP is currently stable off of meds. Tries to monitor her sodium.    3) ADD-  Reports good focus with focalin.    Review of Systems See HPI  No past medical history on file.  History   Social History  . Marital Status: Married    Spouse Name: N/A    Number of Children: N/A  . Years of Education: N/A   Occupational History  . Not on file.   Social History Main Topics  . Smoking status: Never Smoker   . Smokeless tobacco: Never Used  . Alcohol Use: Not on file  . Drug Use: Not on file  . Sexual Activity: Not on file   Other Topics Concern  . Not on file   Social History Narrative  . No narrative on file    No past surgical history on file.  No family history on file.  Allergies  Allergen Reactions  . Amoxicillin     REACTION: Upset Stomach    Current Outpatient Prescriptions on File Prior to Visit  Medication Sig Dispense Refill  . ALPRAZolam (XANAX) 0.5 MG tablet TAKE 1 TABLET BY MOUTH EVERY DAY AS NEEDED  30 tablet  0  . desvenlafaxine (PRISTIQ) 50 MG 24 hr tablet Take 1 tablet (50 mg total) by mouth daily.  30 tablet  2  . dexmethylphenidate (FOCALIN) 10 MG tablet take 3/4 tablet every morning and 1/2 tablet every afternoon.  38 tablet  0   No current facility-administered medications on file prior to visit.    BP 130/70  Pulse 90  Temp(Src) 97.9 F (36.6 C) (Oral)  Resp 16  Ht 5\' 2"  (1.575 m)  Wt 209 lb 1.9 oz (94.856 kg)  BMI 38.24 kg/m2  SpO2 94%       Objective:   Physical Exam  Constitutional: She is oriented to person, place, and time. She appears well-developed and  well-nourished. No distress.  HENT:  Head: Normocephalic and atraumatic.  Right Ear: Tympanic membrane and ear canal normal.  Left Ear: Tympanic membrane and ear canal normal.  Mouth/Throat: No oropharyngeal exudate, posterior oropharyngeal edema, posterior oropharyngeal erythema or tonsillar abscesses.  Neck: Thyromegaly present.  Cardiovascular: Normal rate and regular rhythm.   No murmur heard. Pulmonary/Chest: Effort normal and breath sounds normal. No respiratory distress. She has no wheezes. She has no rales. She exhibits no tenderness.  Neurological: She is alert and oriented to person, place, and time.  Skin: Skin is warm.  Psychiatric: She has a normal mood and affect. Her behavior is normal. Judgment and thought content normal.          Assessment & Plan:

## 2013-06-14 NOTE — Assessment & Plan Note (Signed)
Check thyroid ultrasound. Check tfts.

## 2013-06-14 NOTE — Assessment & Plan Note (Signed)
Stable on focalin, continue same.  

## 2013-06-14 NOTE — Assessment & Plan Note (Signed)
Stable off of meds.  Monitor.  

## 2013-06-28 ENCOUNTER — Ambulatory Visit (HOSPITAL_BASED_OUTPATIENT_CLINIC_OR_DEPARTMENT_OTHER): Payer: BC Managed Care – PPO

## 2013-07-15 ENCOUNTER — Telehealth: Payer: Self-pay | Admitting: Family

## 2013-07-15 MED ORDER — DEXMETHYLPHENIDATE HCL 10 MG PO TABS: ORAL_TABLET | ORAL | Status: DC

## 2013-07-15 NOTE — Telephone Encounter (Signed)
Rx last printed 06/14/13, #38 x no refills. Rx printed and forwarded to Provider for signature.

## 2013-07-15 NOTE — Telephone Encounter (Signed)
REFILL FOCALIN 10 MG

## 2013-07-16 NOTE — Telephone Encounter (Signed)
Rx was signed and given to pt.

## 2013-08-19 ENCOUNTER — Telehealth: Payer: Self-pay | Admitting: *Deleted

## 2013-08-19 MED ORDER — DEXMETHYLPHENIDATE HCL 10 MG PO TABS: ORAL_TABLET | ORAL | Status: DC

## 2013-08-19 NOTE — Telephone Encounter (Signed)
Focalin rx last printed 07/15/13, #38.  Pt is due for follow up in June. Rx printed and forwarded to Provider for signature.

## 2013-08-19 NOTE — Telephone Encounter (Signed)
Rx completed, attempted to notify pt and received message that # could not be completed as dialed. Will try again later.

## 2013-08-20 NOTE — Telephone Encounter (Signed)
Rx sent to front desk and pt notified. 

## 2013-09-17 ENCOUNTER — Encounter: Payer: Self-pay | Admitting: Nurse Practitioner

## 2013-09-17 ENCOUNTER — Ambulatory Visit (INDEPENDENT_AMBULATORY_CARE_PROVIDER_SITE_OTHER): Payer: BC Managed Care – PPO | Admitting: Nurse Practitioner

## 2013-09-17 VITALS — BP 178/110 | HR 118 | Temp 97.8°F | Ht 62.0 in | Wt 203.5 lb

## 2013-09-17 DIAGNOSIS — L408 Other psoriasis: Secondary | ICD-10-CM

## 2013-09-17 DIAGNOSIS — L409 Psoriasis, unspecified: Secondary | ICD-10-CM

## 2013-09-17 DIAGNOSIS — R05 Cough: Secondary | ICD-10-CM

## 2013-09-17 DIAGNOSIS — R059 Cough, unspecified: Secondary | ICD-10-CM

## 2013-09-17 MED ORDER — HYDROCODONE-HOMATROPINE 5-1.5 MG/5ML PO SYRP: 5.0000 mL | ORAL_SOLUTION | Freq: Every evening | ORAL | Status: DC | PRN

## 2013-09-17 NOTE — Progress Notes (Signed)
Pre visit review using our clinic review tool, if applicable. No additional management support is needed unless otherwise documented below in the visit note. 

## 2013-09-17 NOTE — Patient Instructions (Signed)
You have a respiratory virus causing your symptoms. The average duration  14 days. Start daily sinus rinses (neilmed Sinus Rinse).  Sip fluids every hour. Rest. Use cough syrup for night time. If you are not feeling better in 1 week or develop fever or chest pain, call us for re-evaluation.  Use [place] Tea Tree Oil in ear twice daily (Q-tip). If no improvement, a steroid cream can be prescribed. Feel better!  Upper Respiratory Infection, Adult An upper respiratory infection (URI) is also sometimes known as the common cold. The upper respiratory tract includes the nose, sinuses, throat, trachea, and bronchi. Bronchi are the airways leading to the lungs. Most people improve within 1 week, but symptoms can last up to 2 weeks. A residual cough may last even longer.  CAUSES Many different viruses can infect the tissues lining the upper respiratory tract. The tissues become irritated and inflamed and often become very moist. Mucus production is also common. A cold is contagious. You can easily spread the virus to others by oral contact. This includes kissing, sharing a glass, coughing, or sneezing. Touching your mouth or nose and then touching a surface, which is then touched by another person, can also spread the virus. SYMPTOMS  Symptoms typically develop 1 to 3 days after you come in contact with a cold virus. Symptoms vary from person to person. They may include:  Runny nose.  Sneezing.  Nasal congestion.  Sinus irritation.  Sore throat.  Loss of voice (laryngitis).  Cough.  Fatigue.  Muscle aches.  Loss of appetite.  Headache.  Low-grade fever. DIAGNOSIS  You might diagnose your own cold based on familiar symptoms, since most people get a cold 2 to 3 times a year. Your caregiver can confirm this based on your exam. Most importantly, your caregiver can check that your symptoms are not due to another disease such as strep throat, sinusitis, pneumonia, asthma, or epiglottitis. Blood  tests, throat tests, and X-rays are not necessary to diagnose a common cold, but they may sometimes be helpful in excluding other more serious diseases. Your caregiver will decide if any further tests are required. RISKS AND COMPLICATIONS  You may be at risk for a more severe case of the common cold if you smoke cigarettes, have chronic heart disease (such as heart failure) or lung disease (such as asthma), or if you have a weakened immune system. The very young and very old are also at risk for more serious infections. Bacterial sinusitis, middle ear infections, and bacterial pneumonia can complicate the common cold. The common cold can worsen asthma and chronic obstructive pulmonary disease (COPD). Sometimes, these complications can require emergency medical care and may be life-threatening. PREVENTION  The best way to protect against getting a cold is to practice good hygiene. Avoid oral or hand contact with people with cold symptoms. Wash your hands often if contact occurs. There is no clear evidence that vitamin C, vitamin E, echinacea, or exercise reduces the chance of developing a cold. However, it is always recommended to get plenty of rest and practice good nutrition. TREATMENT  Treatment is directed at relieving symptoms. There is no cure. Antibiotics are not effective, because the infection is caused by a virus, not by bacteria. Treatment may include:  Increased fluid intake. Sports drinks offer valuable electrolytes, sugars, and fluids.  Breathing heated mist or steam (vaporizer or shower).  Eating chicken soup or other clear broths, and maintaining good nutrition.  Getting plenty of rest.  Using gargles or lozenges  for comfort.  Controlling fevers with ibuprofen or acetaminophen as directed by your caregiver.  Increasing usage of your inhaler if you have asthma. Zinc gel and zinc lozenges, taken in the first 24 hours of the common cold, can shorten the duration and lessen the  severity of symptoms. Pain medicines may help with fever, muscle aches, and throat pain. A variety of non-prescription medicines are available to treat congestion and runny nose. Your caregiver can make recommendations and may suggest nasal or lung inhalers for other symptoms.  HOME CARE INSTRUCTIONS   Only take over-the-counter or prescription medicines for pain, discomfort, or fever as directed by your caregiver.  Use a warm mist humidifier or inhale steam from a shower to increase air moisture. This may keep secretions moist and make it easier to breathe.  Drink enough water and fluids to keep your urine clear or pale yellow.  Rest as needed.  Return to work when your temperature has returned to normal or as your caregiver advises. You may need to stay home longer to avoid infecting others. You can also use a face mask and careful hand washing to prevent spread of the virus. SEEK MEDICAL CARE IF:   After the first few days, you feel you are getting worse rather than better.  You need your caregiver's advice about medicines to control symptoms.  You develop chills, worsening shortness of breath, or brown or red sputum. These may be signs of pneumonia.  You develop yellow or brown nasal discharge or pain in the face, especially when you bend forward. These may be signs of sinusitis.  You develop a fever, swollen neck glands, pain with swallowing, or white areas in the back of your throat. These may be signs of strep throat. SEEK IMMEDIATE MEDICAL CARE IF:   You have a fever.  You develop severe or persistent headache, ear pain, sinus pain, or chest pain.  You develop wheezing, a prolonged cough, cough up blood, or have a change in your usual mucus (if you have chronic lung disease).  You develop sore muscles or a stiff neck. Document Released: 12/21/2000 Document Revised: 09/19/2011 Document Reviewed: 10/29/2010 [facility] Patient Information 2014 [place], [place].

## 2013-09-17 NOTE — Progress Notes (Signed)
   Subjective:    Patient ID: Eileen Foster, female    DOB: 08/17/1980, 33 y.o.   MRN: [id number]  URI  This is a new problem. The current episode started in the past 7 days (5da). The problem has been unchanged. There has been no fever. Associated symptoms include congestion, coughing and headaches. Pertinent negatives include no abdominal pain, chest pain, diarrhea, dysuria, ear pain, nausea, sore throat or wheezing. She has tried nothing for the symptoms.      Review of Systems  Constitutional: Negative for fever, chills and fatigue.  HENT: Positive for congestion, postnasal drip and sinus pressure. Negative for ear pain, sore throat and voice change.   Respiratory: Positive for cough. Negative for chest tightness, shortness of breath and wheezing.   Cardiovascular: Negative for chest pain.  Gastrointestinal: Negative for nausea, abdominal pain and diarrhea.  Genitourinary: Negative for dysuria.  Musculoskeletal: Negative for back pain.  Neurological: Positive for headaches.       Objective:   Physical Exam  Vitals reviewed. Constitutional: She is oriented to person, place, and time. She appears well-developed and well-nourished. No distress.  HENT:  Head: Normocephalic and atraumatic.  Right Ear: External ear normal.  Left Ear: External ear normal.  Mouth/Throat: Oropharynx is clear and moist. No oropharyngeal exudate.  Moderate scaly skin on pink base in R canal.  Eyes: Conjunctivae are normal. Right eye exhibits no discharge. Left eye exhibits no discharge.  Neck: Normal range of motion. Neck supple. Thyromegaly (thyroid feels slightly full bilat) present.  Cardiovascular: Normal rate, regular rhythm and normal heart sounds.   No murmur heard. Pulmonary/Chest: Effort normal and breath sounds normal. No respiratory distress. She has no wheezes. She has no rales.  Lymphadenopathy:    She has cervical adenopathy (bilat tonsilar nodes mildly enlarged,tender, moveable.).    Neurological: She is alert and oriented to person, place, and time.  Skin: Skin is warm and dry.  Psychiatric: She has a normal mood and affect. Her behavior is normal. Thought content normal.          Assessment & Plan:  1. Cough 5 days. URI. Nasal congestion. Daily sinus rinses. - HYDROcodone-homatropine (HYCODAN) 5-1.5 MG/5ML syrup; Take 5 mLs by mouth at bedtime as needed for cough.  Dispense: 120 mL; Refill: 0  2. Psoriasis R ear canal. Use OTC tea tree oil twice daily. If no improvement will prescribe steroid cream.

## 2013-09-23 ENCOUNTER — Telehealth: Payer: Self-pay | Admitting: Family

## 2013-09-23 MED ORDER — DEXMETHYLPHENIDATE HCL 10 MG PO TABS: ORAL_TABLET | ORAL | Status: DC

## 2013-09-23 NOTE — Telephone Encounter (Signed)
Rx sent to front desk for pick up and pt notified. 

## 2013-09-23 NOTE — Telephone Encounter (Signed)
Last rx printed on 08/19/13.  Pt due for follow up in June.  Rx printed and forwarded to Provider for signature.

## 2013-09-23 NOTE — Telephone Encounter (Signed)
FOCALIN RX

## 2013-10-25 ENCOUNTER — Telehealth: Payer: Self-pay | Admitting: *Deleted

## 2013-10-25 ENCOUNTER — Telehealth: Payer: Self-pay | Admitting: Family

## 2013-10-25 MED ORDER — DEXMETHYLPHENIDATE HCL 10 MG PO TABS: ORAL_TABLET | ORAL | Status: DC

## 2013-10-25 NOTE — Telephone Encounter (Signed)
Signed.

## 2013-10-25 NOTE — Telephone Encounter (Signed)
Notified pt and sent Rx to front desk for pick up.

## 2013-10-25 NOTE — Telephone Encounter (Signed)
PA form received for Dexmethypjenidate, forward to nurse

## 2013-10-25 NOTE — Telephone Encounter (Signed)
Pt left message requesting to pick up rx for Focalin today. Last rx printed 09/23/13. Rx printed and forwarded to Provider for signature.

## 2013-10-29 NOTE — Telephone Encounter (Signed)
Completed PA through covermy meds and received approval. Notified pt and pharmacy.

## 2013-11-25 ENCOUNTER — Telehealth: Payer: Self-pay | Admitting: *Deleted

## 2013-11-25 MED ORDER — DEXMETHYLPHENIDATE HCL 10 MG PO TABS: ORAL_TABLET | ORAL | Status: DC

## 2013-11-25 NOTE — Telephone Encounter (Signed)
Pt left message requesting refill of focalin. Rx last printed 10/25/13, #38.  Rx printed and forwarded to Provider for signature. Pt is due for 6 month f/u in June.

## 2013-11-25 NOTE — Telephone Encounter (Signed)
Rx signed and sent to front desk for pick up and pt notified.

## 2013-12-24 ENCOUNTER — Telehealth: Payer: Self-pay | Admitting: Family

## 2013-12-24 DIAGNOSIS — F909 Attention-deficit hyperactivity disorder, unspecified type: Secondary | ICD-10-CM

## 2013-12-24 NOTE — Telephone Encounter (Signed)
Patient is requesting to pick up refill on Focalin on Thursday morning, she is going out of town and would like to pick it up on her way

## 2013-12-26 MED ORDER — DEXMETHYLPHENIDATE HCL 10 MG PO TABS: ORAL_TABLET | ORAL | Status: DC

## 2013-12-26 NOTE — Telephone Encounter (Signed)
Patient called back regarding this. She states that she will be leaving for vacation tonight.

## 2013-12-26 NOTE — Telephone Encounter (Signed)
Patient is due for follow up this month.  Can pt get her Rx?  Last RX was 11/25/13.  Please advise.

## 2013-12-26 NOTE — Telephone Encounter (Signed)
Informed patient that rx is ready for pickup and she scheduled appointment for 01/14/14

## 2013-12-26 NOTE — Telephone Encounter (Signed)
Refill granted.  No additional refills will be given until she follow-up with Melissa.  [facility] to tell her to pick up Rx.  Rx ready for pickup at desk.

## 2014-01-14 ENCOUNTER — Encounter: Payer: Self-pay | Admitting: Family

## 2014-01-14 ENCOUNTER — Telehealth: Payer: Self-pay | Admitting: Family

## 2014-01-14 ENCOUNTER — Ambulatory Visit (INDEPENDENT_AMBULATORY_CARE_PROVIDER_SITE_OTHER): Payer: BC Managed Care – PPO | Admitting: Family

## 2014-01-14 VITALS — BP 138/82 | HR 87 | Temp 98.2°F | Resp 16 | Ht 62.0 in | Wt 176.1 lb

## 2014-01-14 DIAGNOSIS — F988 Other specified behavioral and emotional disorders with onset usually occurring in childhood and adolescence: Secondary | ICD-10-CM

## 2014-01-14 DIAGNOSIS — I10 Essential (primary) hypertension: Secondary | ICD-10-CM

## 2014-01-14 DIAGNOSIS — F909 Attention-deficit hyperactivity disorder, unspecified type: Secondary | ICD-10-CM

## 2014-01-14 DIAGNOSIS — F341 Dysthymic disorder: Secondary | ICD-10-CM

## 2014-01-14 DIAGNOSIS — E010 Iodine-deficiency related diffuse (endemic) goiter: Secondary | ICD-10-CM

## 2014-01-14 DIAGNOSIS — E049 Nontoxic goiter, unspecified: Secondary | ICD-10-CM

## 2014-01-14 DIAGNOSIS — H60509 Unspecified acute noninfective otitis externa, unspecified ear: Secondary | ICD-10-CM

## 2014-01-14 DIAGNOSIS — F908 Attention-deficit hyperactivity disorder, other type: Secondary | ICD-10-CM

## 2014-01-14 DIAGNOSIS — G43009 Migraine without aura, not intractable, without status migrainosus: Secondary | ICD-10-CM

## 2014-01-14 DIAGNOSIS — H60542 Acute eczematoid otitis externa, left ear: Secondary | ICD-10-CM

## 2014-01-14 MED ORDER — BETAMETHASONE DIPROPIONATE 0.05 % EX CREA: TOPICAL_CREAM | Freq: Two times a day (BID) | CUTANEOUS | Status: DC

## 2014-01-14 MED ORDER — ALPRAZOLAM 0.5 MG PO TABS: ORAL_TABLET | ORAL | Status: AC

## 2014-01-14 MED ORDER — DEXMETHYLPHENIDATE HCL 10 MG PO TABS: ORAL_TABLET | ORAL | Status: DC

## 2014-01-14 NOTE — Assessment & Plan Note (Signed)
Fair BP today. Continue diet,exercise weight loss. Follow up in 3 months for BP recheck.

## 2014-01-14 NOTE — Assessment & Plan Note (Signed)
Discussed importance of following through with recommended labs and [place]. She will be travelling to [place] and wishes to complete US after she returns.

## 2014-01-14 NOTE — Assessment & Plan Note (Signed)
Stable

## 2014-01-14 NOTE — Assessment & Plan Note (Signed)
Recommend topical application of prn betamethasone.

## 2014-01-14 NOTE — Assessment & Plan Note (Signed)
Stable depression off meds. Using xanax prn- very rare use.  Xanax refill provided today.

## 2014-01-14 NOTE — Assessment & Plan Note (Signed)
Stable on current dose of focalin, continue same.

## 2014-01-14 NOTE — Telephone Encounter (Signed)
Relevant patient education assigned to patient using Emmi. ° °

## 2014-01-14 NOTE — Patient Instructions (Addendum)
Please complete lab work prior to leaving. You will be contacted about scheduling thyroid ultrasound to be completed after you return from your trip.   Continue the good work on [employer] diet, exercise, weight loss! You can apply betamethasone to ear canal once daily as needed. Follow up in 3 months.

## 2014-01-14 NOTE — Progress Notes (Signed)
Subjective:    Patient ID: Eileen Foster, female    DOB: 06/19/1981, 33 y.o.   MRN: [id number]  HPI  Ms. Eileen Foster is a 33 yr old female who presents today for follow up of multiple medical problems:  1) HTN- not currently on antihypertensives.  She has been limiting sodium.  Has lost weight with diet/exercise since April.  Feeling much better.   Wt Readings from Last 3 Encounters:  01/14/14 176 lb 1.3 oz (79.869 kg)  09/17/13 203 lb 8 oz (92.307 kg)  06/14/13 209 lb 1.9 oz (94.856 kg)    BP Readings from Last 3 Encounters:  01/14/14 138/82  09/17/13 178/110  06/14/13 130/70   2) Depression- she is currently off of antidepressants.  She continues to use alprazolam on an as needed basis- has not needed in some time.  Report that depression is well controlled.   3) ADD- patient is maintained on focalin.  Reports ADD is well controlled. Rarely needs PM dose. 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    4) Migraine-Denies recent migraines.  5) thyromegaly- last visit TFT's and Thyroid US were ordered,  However, pt did not complete these tests.  6) Notes that she has some dry scaling skin in ear.  Finds it irritating.  Review of Systems    see HPI  No past medical history on file.  History   Social History  . Marital Status: Married    Spouse Name: N/A    Number of Children: N/A  . Years of Education: N/A   Occupational History  . Not on file.   Social History Main Topics  . Smoking status: Never Smoker   . Smokeless tobacco: Never Used  . Alcohol Use: Not on file  . Drug Use: Not on file  . Sexual Activity: Not on file   Other Topics Concern  . Not on file   Social History Narrative  . No narrative on file    No past surgical history on file.  No family history on file.  Allergies  Allergen Reactions  . Amoxicillin     REACTION: Upset Stomach    Current Outpatient Prescriptions on File Prior to Visit  Medication Sig Dispense Refill  . ALPRAZolam (XANAX)  0.5 MG tablet TAKE 1 TABLET BY MOUTH EVERY DAY AS NEEDED  30 tablet  0  . dexmethylphenidate (FOCALIN) 10 MG tablet take 3/4 tablet every morning and 1/2 tablet every afternoon.  38 tablet  0   No current facility-administered medications on file prior to visit.    BP 138/82  Pulse 87  Temp(Src) 98.2 F (36.8 C) (Oral)  Resp 16  Ht 5\' 2"  (1.575 m)  Wt 176 lb 1.3 oz (79.869 kg)  BMI 32.20 kg/m2  SpO2 97%    Objective:   Physical Exam  Constitutional: She is oriented to person, place, and time. She appears well-developed and well-nourished. No distress.  HENT:  Some dry scaly skin noted bilateral ear canals.    Neck: Thyromegaly present.  L thyroid lobe is larger than right thyroid lobe  Cardiovascular: Normal rate and regular rhythm.   No murmur heard. Pulmonary/Chest: Effort normal and breath sounds normal. No respiratory distress. She has no wheezes. She has no rales. She exhibits no tenderness.  Neurological: She is alert and oriented to person, place, and time.  Psychiatric: She has a normal mood and affect. Her behavior is normal. Judgment and thought content normal.  Assessment & Plan:

## 2014-02-03 ENCOUNTER — Ambulatory Visit (HOSPITAL_BASED_OUTPATIENT_CLINIC_OR_DEPARTMENT_OTHER): Payer: BC Managed Care – PPO

## 2014-02-20 ENCOUNTER — Telehealth: Payer: Self-pay

## 2014-02-20 DIAGNOSIS — F908 Attention-deficit hyperactivity disorder, other type: Secondary | ICD-10-CM

## 2014-02-20 MED ORDER — DEXMETHYLPHENIDATE HCL 10 MG PO TABS: ORAL_TABLET | ORAL | Status: DC

## 2014-02-20 NOTE — Telephone Encounter (Signed)
Patient called back regarding this.

## 2014-02-20 NOTE — Telephone Encounter (Signed)
Please call and inform patient this is ready

## 2014-02-20 NOTE — Telephone Encounter (Signed)
OK to refill Focalin

## 2014-02-20 NOTE — Telephone Encounter (Signed)
Pt stated"she would like refill on Focalin."LDM

## 2014-02-21 NOTE — Telephone Encounter (Signed)
Per marj, patient has already picked up rx

## 2014-02-21 NOTE — Telephone Encounter (Signed)
Attempted to notify pt of completed Rx and recording states number could not be completed as dialed.

## 2014-03-24 ENCOUNTER — Telehealth: Payer: Self-pay | Admitting: Family

## 2014-03-24 DIAGNOSIS — F908 Attention-deficit hyperactivity disorder, other type: Secondary | ICD-10-CM

## 2014-03-24 MED ORDER — DEXMETHYLPHENIDATE HCL 10 MG PO TABS: ORAL_TABLET | ORAL | Status: DC

## 2014-03-24 NOTE — Telephone Encounter (Signed)
Caller name: [name] Relation to pt: self  Call back number: [phone]  Pharmacy: [facility]   Reason for call:  pt requesting a refill dexmethylphenidate (FOCALIN) 10 MG tablet

## 2014-03-24 NOTE — Telephone Encounter (Signed)
Last Focalin Rx printed 02/20/14, #38.  Rx printed and forwarded to Provider for signature.  Pt will be due for f/u of BP on or after 04/16/14.

## 2014-03-24 NOTE — Telephone Encounter (Signed)
Rx signed and sent to front desk for pick up. Notified pt.

## 2014-04-16 ENCOUNTER — Telehealth: Payer: Self-pay | Admitting: *Deleted

## 2014-04-16 NOTE — Telephone Encounter (Signed)
Received call from [place] at [facility] stating pt contacted him for a flonase prescription. Advised him that I do not see we had ever prescribed this for her before and would call her first. Spoke with pt. She states that [facility] was [facility] at [facility] weeks ago and they were going to prescribe flonase for her allergy symptoms. She states she was trying to get [facility]. Pt is aware that this can be purchased over the counter but would be cheaper as Rx through her insurance. Pt states [facility] will [facility] and if needs further assistance from us will let us know.

## 2014-04-22 ENCOUNTER — Telehealth: Payer: Self-pay | Admitting: Family

## 2014-04-22 DIAGNOSIS — F908 Attention-deficit hyperactivity disorder, other type: Secondary | ICD-10-CM

## 2014-04-22 MED ORDER — DEXMETHYLPHENIDATE HCL 10 MG PO TABS: ORAL_TABLET | ORAL | Status: DC

## 2014-04-22 NOTE — Telephone Encounter (Signed)
Caller name: Kadasia  Relation to pt: self  Call back number: [phone] Pharmacy:[facility] [facility] [place] - [address]  [phone]   Reason for call: pt requesting a refill dexmethylphenidate (FOCALIN) 10 MG tablet

## 2014-04-22 NOTE — Telephone Encounter (Signed)
Notified pt Rx has been placed at front desk for pick up.

## 2014-04-22 NOTE — Telephone Encounter (Signed)
Last rx printed 03/24/14. Printed Rx and forwarded to Provider for signature.  Pt is due for follow up of BP this month. Will need to arrange when we call pt to pick up Rx.

## 2014-05-21 ENCOUNTER — Telehealth: Payer: Self-pay | Admitting: Family

## 2014-05-21 DIAGNOSIS — F908 Attention-deficit hyperactivity disorder, other type: Secondary | ICD-10-CM

## 2014-05-21 NOTE — Telephone Encounter (Signed)
Caller name: Jinny Relation to pt: self Call back number: [phone] Pharmacy:  Reason for call:   Patient would like focalin refill

## 2014-05-22 MED ORDER — DEXMETHYLPHENIDATE HCL 10 MG PO TABS: ORAL_TABLET | ORAL | Status: DC

## 2014-05-22 NOTE — Telephone Encounter (Signed)
Rx sent to front desk, notified pt.

## 2014-05-22 NOTE — Telephone Encounter (Signed)
Rx printed and forwarded to Provider for signature. Pt was last seen in 01/2014 and advised 3 month f/u; will need office visit for further refills.

## 2014-06-23 ENCOUNTER — Telehealth: Payer: Self-pay | Admitting: Family

## 2014-06-23 DIAGNOSIS — F908 Attention-deficit hyperactivity disorder, other type: Secondary | ICD-10-CM

## 2014-06-23 MED ORDER — DEXMETHYLPHENIDATE HCL 10 MG PO TABS: ORAL_TABLET | ORAL | Status: DC

## 2014-06-23 NOTE — Telephone Encounter (Signed)
Focalin rx printed and forwarded to Provider for signature.  Pt will need follow up before further refills can be given.

## 2014-06-23 NOTE — Telephone Encounter (Signed)
focalin 10 mg

## 2014-06-24 NOTE — Telephone Encounter (Signed)
rx signed . Pt notified of pick up at front desk

## 2014-06-24 NOTE — Telephone Encounter (Signed)
rx pending for signature.

## 2014-06-24 NOTE — Telephone Encounter (Signed)
Pt calling checking on the status of rx dexmethylphenidate (FOCALIN) 10 MG tablet

## 2014-06-25 NOTE — Telephone Encounter (Signed)
Call could not be completed as dial. Will try again later.

## 2014-06-25 NOTE — Telephone Encounter (Signed)
Patient called back and states that she will callback to schedule follow up.

## 2014-06-25 NOTE — Telephone Encounter (Signed)
Please call pt and arrange f/u in the next 1 month per note below if not already scheduled.

## 2014-07-21 ENCOUNTER — Telehealth: Payer: Self-pay | Admitting: Family

## 2014-07-21 NOTE — Telephone Encounter (Signed)
Caller name: Daiva NakayamaHooks, Honestii A Relation to pt: self  Call back number: [phone]  Reason for call:  Pt requesting a refill dexmethylphenidate (FOCALIN) 10 MG tablet.

## 2014-07-21 NOTE — Telephone Encounter (Signed)
Pt states she has an appointment with [facility] 08/05/13 and would like a refill to hold her over. Pt states her insurance will not cover two appointments regarding medication. i advised pt NP instructions.

## 2014-07-21 NOTE — Telephone Encounter (Signed)
Noted, needs appointment before I can give any refills.

## 2014-07-21 NOTE — Telephone Encounter (Signed)
error:[id number] ° °

## 2014-07-21 NOTE — Telephone Encounter (Signed)
Please notify pt that refill cannot be given until she is seen for follow up. Pt was due for follow up in October.

## 2014-12-17 ENCOUNTER — Encounter: Payer: BC Managed Care – PPO | Admitting: Nurse Practitioner

## 2014-12-17 NOTE — Progress Notes (Signed)
This encounter was created in error - please disregard.

## 2022-03-15 ENCOUNTER — Other Ambulatory Visit: Payer: Self-pay | Admitting: Otolaryngology

## 2022-04-05 ENCOUNTER — Other Ambulatory Visit (HOSPITAL_COMMUNITY): Payer: BC Managed Care – PPO

## 2022-04-05 NOTE — Pre-Procedure Instructions (Signed)
Surgical Instructions    Your procedure is scheduled on Wednesday, October 4th.  Report to [facility] Main Entrance "A" at 10:30 A.M., then check in with the Admitting office.  Call this number if you have problems the morning of surgery:  [phone]   If you have any questions prior to your surgery date call [phone]: Open Monday-Friday 8am-4pm    Remember:  Do not eat after midnight the night before your surgery  You may drink clear liquids until 9:30 a.m. the morning of your surgery.   Clear liquids allowed are: Water, Non-Citrus Juices (without pulp), Carbonated Beverages, Clear Tea, Black Coffee Only (NO MILK, CREAM OR POWDERED CREAMER of any kind), and Gatorade.    Take these medicines the morning of surgery with A SIP OF WATER  Brimonidine Tartrate (LUMIFY) DULoxetine (CYMBALTA) leucovorin (WELLCOVORIN)    Take these medications as needed: ALPRAZolam (XANAX)-if needed Eye drops predniSONE (DELTASONE) triamcinolone (NASACORT ALLERGY 24HR)  Please consult with your provider in regards to your medication sulfaSALAzine (AZULFIDINE). This medication may need to be held prior to surgery.   As of today, STOP taking any Aspirin (unless otherwise instructed by your surgeon) Aleve, Naproxen, Ibuprofen, Motrin, Advil, Goody's, BC's, all herbal medications, fish oil, and all vitamins.                     Do NOT Smoke (Tobacco/Vaping) for 24 hours prior to your procedure.  If you use a CPAP at night, you may bring your mask/headgear for your overnight stay.   Contacts, glasses, piercing's, hearing aid's, dentures or partials may not be worn into surgery, please bring cases for these belongings.    For patients admitted to the hospital, discharge time will be determined by your treatment team.   Patients discharged the day of surgery will not be allowed to drive home, and someone needs to stay with them for 24 hours.  SURGICAL WAITING ROOM VISITATION Patients having surgery  or a procedure may have no more than 2 support people in the waiting area - these visitors may rotate.   Children under the age of 85 must have an adult with them who is not the patient. If the patient needs to stay at the hospital during part of their recovery, the visitor guidelines for inpatient rooms apply. Pre-op nurse will coordinate an appropriate time for 1 support person to accompany patient in pre-op.  This support person may not rotate.   Please refer to the [facility] website for the visitor guidelines for Inpatients (after your surgery is over and you are in a regular room).    Special instructions:   [place]- Preparing For Surgery  Before surgery, you can play an important role. Because skin is not sterile, your skin needs to be as free of germs as possible. You can reduce the number of germs on your skin by washing with CHG (chlorahexidine gluconate) Soap before surgery.  CHG is an antiseptic cleaner which kills germs and bonds with the skin to continue killing germs even after washing.    Oral Hygiene is also important to reduce your risk of infection.  Remember - BRUSH YOUR TEETH THE MORNING OF SURGERY WITH YOUR REGULAR TOOTHPASTE  Please do not use if you have an allergy to CHG or antibacterial soaps. If your skin becomes reddened/irritated stop using the CHG.  Do not shave (including legs and underarms) for at least 48 hours prior to first CHG shower. It is OK to shave your face.  Please follow these instructions carefully.   Shower the NIGHT BEFORE SURGERY and the MORNING OF SURGERY  If you chose to wash your hair, wash your hair first as usual with your normal shampoo.  After you shampoo, rinse your hair and body thoroughly to remove the shampoo.  Use CHG Soap as you would any other liquid soap. You can apply CHG directly to the skin and wash gently with a scrungie or a clean washcloth.   Apply the CHG Soap to your body ONLY FROM THE NECK DOWN.  Do not use on open  wounds or open sores. Avoid contact with your eyes, ears, mouth and genitals (private parts). Wash Face and genitals (private parts)  with your normal soap.   Wash thoroughly, paying special attention to the area where your surgery will be performed.  Thoroughly rinse your body with warm water from the neck down.  DO NOT shower/wash with your normal soap after using and rinsing off the CHG Soap.  Pat yourself dry with a CLEAN TOWEL.  Wear CLEAN PAJAMAS to bed the night before surgery  Place CLEAN SHEETS on your bed the night before your surgery  DO NOT SLEEP WITH PETS.   Day of Surgery: Take a shower with CHG soap. Do not wear jewelry or makeup Do not wear lotions, powders, perfumes, or deodorant. Do not shave 48 hours prior to surgery.   Do not bring valuables to the hospital.  [facility] is not responsible for any belongings or valuables. Do not wear nail polish, gel polish, artificial nails, or any other type of covering on natural nails (fingers and toes) If you have artificial nails or gel coating that need to be removed by a nail salon, please have this removed prior to surgery. Artificial nails or gel coating may interfere with anesthesia's ability to adequately monitor your vital signs. Wear Clean/Comfortable clothing the morning of surgery Remember to brush your teeth WITH YOUR REGULAR TOOTHPASTE.   Please read over the following fact sheets that you were given.    If you received a COVID test during your pre-op visit  it is requested that you wear a mask when out in public, stay away from anyone that may not be feeling well and notify your surgeon if you develop symptoms. If you have been in contact with anyone that has tested positive in the last 10 days please notify you surgeon.

## 2022-04-06 ENCOUNTER — Encounter (HOSPITAL_COMMUNITY): Payer: Self-pay

## 2022-04-06 ENCOUNTER — Inpatient Hospital Stay (HOSPITAL_COMMUNITY)
Admission: RE | Admit: 2022-04-06 | Discharge: 2022-04-06 | Disposition: A | Discharge status: Home / Self Care | Payer: Self-pay | Source: Ambulatory Visit

## 2022-04-06 ENCOUNTER — Other Ambulatory Visit: Payer: Self-pay

## 2022-04-06 VITALS — Ht 63.0 in | Wt 180.0 lb

## 2022-04-06 DIAGNOSIS — Z01818 Encounter for other preprocedural examination: Secondary | ICD-10-CM

## 2022-04-06 DIAGNOSIS — I10 Essential (primary) hypertension: Secondary | ICD-10-CM

## 2022-04-06 HISTORY — DX: Nontoxic single thyroid nodule: E04.1

## 2022-04-06 HISTORY — DX: Anxiety disorder, unspecified: F41.9

## 2022-04-06 HISTORY — DX: Essential (primary) hypertension: I10

## 2022-04-06 HISTORY — DX: Rheumatoid arthritis, unspecified: M06.9

## 2022-04-06 NOTE — Progress Notes (Signed)
PCP - Maximiano Coss, PA Cardiologist - denies  PPM/ICD - denies   Chest x-ray - denies EKG - denies Stress Test - denies ECHO - denies Cardiac Cath - denies  Sleep Study - denies   DM- denies  ASA/Blood Thinner Instructions: n/a   ERAS Protcol - clears until 0930   COVID TEST- n/a   Anesthesia review: no  Patient denies shortness of breath, fever, cough and chest pain pn phone call   All instructions explained to the patient, with a verbal understanding of the material. Patient agrees to go over the instructions while at home for a better understanding.  The opportunity to ask questions was provided.

## 2022-04-06 NOTE — Progress Notes (Signed)
Pt did not show for PAT appt.  Called pt, she said she has a "stuffy nose" so she didn't want to come in. Pt states she took a COVID test and it was negative. Pt advised that if any new symptoms develop or worsen to contact the surgeon's office. All questions asked over the phone and instructions given. Pt instructed to come in at 1000 so they are able to do the pre-op labwork.

## 2022-04-13 ENCOUNTER — Observation Stay (HOSPITAL_COMMUNITY)
Admission: RE | Admit: 2022-04-13 | Discharge: 2022-04-14 | Disposition: A | Discharge status: Home / Self Care | Payer: BC Managed Care – PPO | Attending: Otolaryngology | Admitting: Otolaryngology

## 2022-04-13 ENCOUNTER — Other Ambulatory Visit: Payer: Self-pay

## 2022-04-13 ENCOUNTER — Encounter (HOSPITAL_COMMUNITY)
Admission: RE | Disposition: A | Discharge status: Home / Self Care | Payer: Self-pay | Source: Home / Self Care | Attending: Otolaryngology

## 2022-04-13 ENCOUNTER — Ambulatory Visit (HOSPITAL_COMMUNITY): Payer: BC Managed Care – PPO | Admitting: Vascular Surgery

## 2022-04-13 ENCOUNTER — Encounter (HOSPITAL_COMMUNITY): Payer: Self-pay | Admitting: Otolaryngology

## 2022-04-13 DIAGNOSIS — Z79899 Other long term (current) drug therapy: Secondary | ICD-10-CM | POA: Diagnosis not present

## 2022-04-13 DIAGNOSIS — E041 Nontoxic single thyroid nodule: Secondary | ICD-10-CM | POA: Diagnosis not present

## 2022-04-13 DIAGNOSIS — Z01818 Encounter for other preprocedural examination: Secondary | ICD-10-CM

## 2022-04-13 DIAGNOSIS — I10 Essential (primary) hypertension: Secondary | ICD-10-CM | POA: Insufficient documentation

## 2022-04-13 HISTORY — PX: THYROID LOBECTOMY: SHX420

## 2022-04-13 LAB — BASIC METABOLIC PANEL
Anion gap: 10 (ref 5–15)
BUN: 9 mg/dL (ref 6–20)
CO2: 23 mmol/L (ref 22–32)
Calcium: 9.8 mg/dL (ref 8.9–10.3)
Chloride: 106 mmol/L (ref 98–111)
Creatinine, Ser: 0.67 mg/dL (ref 0.44–1.00)
GFR, Estimated: >60 mL/min (ref >60)
Glucose, Bld: 94 mg/dL (ref 70–99)
Potassium: 3.8 mmol/L (ref 3.5–5.1)
Sodium: 139 mmol/L (ref 135–145)

## 2022-04-13 LAB — POCT PREGNANCY, URINE: Preg Test, Ur: NEGATIVE

## 2022-04-13 LAB — CBC
HCT: 38.4 % (ref 36.0–46.0)
Hemoglobin: 12.5 g/dL (ref 12.0–15.0)
MCH: 28.9 pg (ref 26.0–34.0)
MCHC: 32.6 g/dL (ref 30.0–36.0)
MCV: 88.9 fL (ref 80.0–100.0)
Platelets: 277 10*3/uL (ref 150–400)
RBC: 4.32 MIL/uL (ref 3.87–5.11)
RDW: 14.5 % (ref 11.5–15.5)
WBC: 6.1 10*3/uL (ref 4.0–10.5)
nRBC: 0 % (ref 0.0–0.2)

## 2022-04-13 SURGERY — LOBECTOMY, THYROID
Anesthesia: General | Site: Neck | Laterality: Left

## 2022-04-13 MED ORDER — FENTANYL CITRATE (PF) 250 MCG/5ML IJ SOLN
INTRAMUSCULAR | Status: AC
  Filled 2022-04-13: qty 5

## 2022-04-13 MED ORDER — LIDOCAINE 2% (20 MG/ML) 5 ML SYRINGE
INTRAMUSCULAR | Status: DC | PRN
  Administered 2022-04-13: 40 mg via INTRAVENOUS

## 2022-04-13 MED ORDER — DEXAMETHASONE SODIUM PHOSPHATE 10 MG/ML IJ SOLN
INTRAMUSCULAR | Status: AC
  Filled 2022-04-13: qty 1

## 2022-04-13 MED ORDER — DEXAMETHASONE SODIUM PHOSPHATE 10 MG/ML IJ SOLN
INTRAMUSCULAR | Status: DC | PRN
  Administered 2022-04-13: 10 mg via INTRAVENOUS

## 2022-04-13 MED ORDER — INFLUENZA VAC SPLIT QUAD 0.5 ML IM SUSY: 0.5000 mL | PREFILLED_SYRINGE | INTRAMUSCULAR | Status: DC

## 2022-04-13 MED ORDER — ALPRAZOLAM 0.25 MG PO TABS: 0.5000 mg | ORAL_TABLET | Freq: Two times a day (BID) | ORAL | Status: DC | PRN

## 2022-04-13 MED ORDER — CEFAZOLIN SODIUM-DEXTROSE 1-4 GM/50ML-% IV SOLN
1.0000 g | Freq: Three times a day (TID) | INTRAVENOUS | Status: DC
  Administered 2022-04-13 – 2022-04-14 (×2): 1 g via INTRAVENOUS
  Filled 2022-04-13 (×5): qty 50

## 2022-04-13 MED ORDER — VANCOMYCIN HCL IN DEXTROSE 1-5 GM/200ML-% IV SOLN
INTRAVENOUS | Status: AC
  Filled 2022-04-13: qty 200

## 2022-04-13 MED ORDER — CEFAZOLIN SODIUM-DEXTROSE 2-4 GM/100ML-% IV SOLN
INTRAVENOUS | Status: AC
  Filled 2022-04-13: qty 100

## 2022-04-13 MED ORDER — MIDAZOLAM HCL 2 MG/2ML IJ SOLN
INTRAMUSCULAR | Status: DC | PRN
  Administered 2022-04-13 (×2): 1 mg via INTRAVENOUS

## 2022-04-13 MED ORDER — ONDANSETRON HCL 4 MG/2ML IJ SOLN
INTRAMUSCULAR | Status: AC
  Filled 2022-04-13: qty 2

## 2022-04-13 MED ORDER — PROPOFOL 500 MG/50ML IV EMUL
INTRAVENOUS | Status: DC | PRN
  Administered 2022-04-13: 25 ug/kg/min via INTRAVENOUS

## 2022-04-13 MED ORDER — 0.9 % SODIUM CHLORIDE (POUR BTL) OPTIME
TOPICAL | Status: DC | PRN
  Administered 2022-04-13: 1000 mL

## 2022-04-13 MED ORDER — LIDOCAINE 2% (20 MG/ML) 5 ML SYRINGE
INTRAMUSCULAR | Status: AC
  Filled 2022-04-13: qty 5

## 2022-04-13 MED ORDER — PROPOFOL 10 MG/ML IV BOLUS
INTRAVENOUS | Status: AC
  Filled 2022-04-13: qty 20

## 2022-04-13 MED ORDER — ROCURONIUM BROMIDE 10 MG/ML (PF) SYRINGE
PREFILLED_SYRINGE | INTRAVENOUS | Status: AC
  Filled 2022-04-13: qty 10

## 2022-04-13 MED ORDER — HYDROMORPHONE HCL 1 MG/ML IJ SOLN: 0.2500 mg | INTRAMUSCULAR | Status: DC | PRN

## 2022-04-13 MED ORDER — LACTATED RINGERS IV SOLN: INTRAVENOUS | Status: DC

## 2022-04-13 MED ORDER — KCL IN DEXTROSE-NACL 20-5-0.45 MEQ/L-%-% IV SOLN: INTRAVENOUS | Status: DC

## 2022-04-13 MED ORDER — PROPOFOL 1000 MG/100ML IV EMUL
INTRAVENOUS | Status: AC
  Filled 2022-04-13: qty 100

## 2022-04-13 MED ORDER — ROCURONIUM BROMIDE 10 MG/ML (PF) SYRINGE
PREFILLED_SYRINGE | INTRAVENOUS | Status: DC | PRN
  Administered 2022-04-13: 50 mg via INTRAVENOUS
  Administered 2022-04-13: 10 mg via INTRAVENOUS

## 2022-04-13 MED ORDER — ONDANSETRON HCL 4 MG/2ML IJ SOLN: 4.0000 mg | Freq: Once | INTRAMUSCULAR | Status: DC | PRN

## 2022-04-13 MED ORDER — OXYCODONE HCL 5 MG/5ML PO SOLN: 5.0000 mg | Freq: Once | ORAL | Status: DC | PRN

## 2022-04-13 MED ORDER — MORPHINE SULFATE (PF) 2 MG/ML IV SOLN: 2.0000 mg | INTRAVENOUS | Status: DC | PRN

## 2022-04-13 MED ORDER — ORAL CARE MOUTH RINSE: 15.0000 mL | Freq: Once | OROMUCOSAL | Status: AC

## 2022-04-13 MED ORDER — KETOROLAC TROMETHAMINE 30 MG/ML IJ SOLN
INTRAMUSCULAR | Status: AC
  Filled 2022-04-13: qty 1

## 2022-04-13 MED ORDER — PROPOFOL 10 MG/ML IV BOLUS
INTRAVENOUS | Status: DC | PRN
  Administered 2022-04-13: 200 mg via INTRAVENOUS

## 2022-04-13 MED ORDER — CHLORHEXIDINE GLUCONATE 0.12 % MT SOLN: 15.0000 mL | Freq: Once | OROMUCOSAL | Status: AC

## 2022-04-13 MED ORDER — LIDOCAINE-EPINEPHRINE 1 %-1:100000 IJ SOLN
INTRAMUSCULAR | Status: DC | PRN
  Administered 2022-04-13: 4 mL

## 2022-04-13 MED ORDER — HEMOSTATIC AGENTS (NO CHARGE) OPTIME
TOPICAL | Status: DC | PRN
  Administered 2022-04-13: 1 via TOPICAL

## 2022-04-13 MED ORDER — DULOXETINE HCL 30 MG PO CPEP
30.0000 mg | ORAL_CAPSULE | Freq: Every day | ORAL | Status: DC
  Administered 2022-04-13: 30 mg via ORAL
  Filled 2022-04-13: qty 1

## 2022-04-13 MED ORDER — ONDANSETRON HCL 4 MG/2ML IJ SOLN
INTRAMUSCULAR | Status: DC | PRN
  Administered 2022-04-13: 4 mg via INTRAVENOUS

## 2022-04-13 MED ORDER — PHENYLEPHRINE 80 MCG/ML (10ML) SYRINGE FOR IV PUSH (FOR BLOOD PRESSURE SUPPORT)
PREFILLED_SYRINGE | INTRAVENOUS | Status: DC | PRN
  Administered 2022-04-13 (×2): 40 ug via INTRAVENOUS
  Administered 2022-04-13 (×2): 80 ug via INTRAVENOUS

## 2022-04-13 MED ORDER — FENTANYL CITRATE (PF) 250 MCG/5ML IJ SOLN
INTRAMUSCULAR | Status: DC | PRN
  Administered 2022-04-13 (×4): 50 ug via INTRAVENOUS
  Administered 2022-04-13: 100 ug via INTRAVENOUS
  Administered 2022-04-13: 50 ug via INTRAVENOUS

## 2022-04-13 MED ORDER — IRBESARTAN 75 MG PO TABS: 75.0000 mg | ORAL_TABLET | Freq: Every day | ORAL | Status: DC

## 2022-04-13 MED ORDER — VANCOMYCIN HCL IN DEXTROSE 1-5 GM/200ML-% IV SOLN: 1000.0000 mg | INTRAVENOUS | Status: DC

## 2022-04-13 MED ORDER — TRIAMCINOLONE ACETONIDE 55 MCG/ACT NA AERO: 2.0000 | INHALATION_SPRAY | Freq: Every day | NASAL | Status: DC | PRN

## 2022-04-13 MED ORDER — HYDROCODONE-ACETAMINOPHEN 5-325 MG PO TABS
1.0000 | ORAL_TABLET | ORAL | Status: DC | PRN
  Administered 2022-04-13: 1 via ORAL
  Filled 2022-04-13: qty 1

## 2022-04-13 MED ORDER — KETOROLAC TROMETHAMINE 30 MG/ML IJ SOLN
30.0000 mg | Freq: Once | INTRAMUSCULAR | Status: AC | PRN
  Administered 2022-04-13: 30 mg via INTRAVENOUS

## 2022-04-13 MED ORDER — BRIMONIDINE TARTRATE 0.2 % OP SOLN: 1.0000 [drp] | Freq: Every day | OPHTHALMIC | Status: DC

## 2022-04-13 MED ORDER — EPHEDRINE SULFATE-NACL 50-0.9 MG/10ML-% IV SOSY
PREFILLED_SYRINGE | INTRAVENOUS | Status: DC | PRN
  Administered 2022-04-13: 5 mg via INTRAVENOUS
  Administered 2022-04-13: 2.5 mg via INTRAVENOUS

## 2022-04-13 MED ORDER — SULFASALAZINE 500 MG PO TABS
1000.0000 mg | ORAL_TABLET | Freq: Two times a day (BID) | ORAL | Status: DC
  Filled 2022-04-13: qty 2

## 2022-04-13 MED ORDER — ONDANSETRON HCL 4 MG/2ML IJ SOLN: 4.0000 mg | INTRAMUSCULAR | Status: DC | PRN

## 2022-04-13 MED ORDER — ONDANSETRON HCL 4 MG PO TABS: 4.0000 mg | ORAL_TABLET | ORAL | Status: DC | PRN

## 2022-04-13 MED ORDER — OXYCODONE HCL 5 MG PO TABS: 5.0000 mg | ORAL_TABLET | Freq: Once | ORAL | Status: DC | PRN

## 2022-04-13 MED ORDER — CHLORHEXIDINE GLUCONATE 0.12 % MT SOLN
OROMUCOSAL | Status: AC
  Administered 2022-04-13: 15 mL via OROMUCOSAL
  Filled 2022-04-13: qty 15

## 2022-04-13 MED ORDER — ACETAMINOPHEN 500 MG PO TABS
1000.0000 mg | ORAL_TABLET | Freq: Once | ORAL | Status: AC
  Administered 2022-04-13: 1000 mg via ORAL
  Filled 2022-04-13: qty 2

## 2022-04-13 MED ORDER — LEUCOVORIN CALCIUM 5 MG PO TABS: 5.0000 mg | ORAL_TABLET | Freq: Every day | ORAL | Status: DC

## 2022-04-13 MED ORDER — DEXMETHYLPHENIDATE HCL 5 MG PO TABS: 5.0000 mg | ORAL_TABLET | Freq: Two times a day (BID) | ORAL | Status: DC

## 2022-04-13 MED ORDER — CEFAZOLIN SODIUM-DEXTROSE 2-4 GM/100ML-% IV SOLN
2.0000 g | Freq: Once | INTRAVENOUS | Status: AC
  Administered 2022-04-13: 2 g via INTRAVENOUS

## 2022-04-13 MED ORDER — LIDOCAINE-EPINEPHRINE 1 %-1:100000 IJ SOLN
INTRAMUSCULAR | Status: AC
  Filled 2022-04-13: qty 1

## 2022-04-13 MED ORDER — SUGAMMADEX SODIUM 200 MG/2ML IV SOLN
INTRAVENOUS | Status: DC | PRN
  Administered 2022-04-13 (×2): 100 mg via INTRAVENOUS

## 2022-04-13 MED ORDER — MIDAZOLAM HCL 2 MG/2ML IJ SOLN
INTRAMUSCULAR | Status: AC
  Filled 2022-04-13: qty 2

## 2022-04-13 MED ORDER — POLYETHYL GLYCOL-PROPYL GLYCOL 0.4-0.3 % OP GEL: Freq: Every day | OPHTHALMIC | Status: DC

## 2022-04-13 MED ORDER — PHENYLEPHRINE 80 MCG/ML (10ML) SYRINGE FOR IV PUSH (FOR BLOOD PRESSURE SUPPORT)
PREFILLED_SYRINGE | INTRAVENOUS | Status: AC
  Filled 2022-04-13: qty 10

## 2022-04-13 MED ORDER — IRBESARTAN 75 MG PO TABS
75.0000 mg | ORAL_TABLET | Freq: Every day | ORAL | Status: DC
  Administered 2022-04-13: 75 mg via ORAL
  Filled 2022-04-13 (×2): qty 1

## 2022-04-13 MED ORDER — PHENYLEPHRINE HCL-NACL 20-0.9 MG/250ML-% IV SOLN
INTRAVENOUS | Status: DC | PRN
  Administered 2022-04-13: 30 ug/min via INTRAVENOUS

## 2022-04-13 SURGICAL SUPPLY — 48 items
ADH SKN CLS APL DERMABOND .7 (GAUZE/BANDAGES/DRESSINGS) ×1
ADH SKN CLS LQ APL DERMABOND (GAUZE/BANDAGES/DRESSINGS) ×1
BAG COUNTER SPONGE SURGICOUNT (BAG) ×2 IMPLANT
BAG SPNG CNTER NS LX DISP (BAG) ×1
BLADE SURG 15 STRL LF DISP TIS (BLADE) IMPLANT
BLADE SURG 15 STRL SS (BLADE)
CANISTER SUCT 3000ML PPV (MISCELLANEOUS) ×2 IMPLANT
CLEANER TIP ELECTROSURG 2X2 (MISCELLANEOUS) ×2 IMPLANT
CNTNR URN SCR LID CUP LEK RST (MISCELLANEOUS) IMPLANT
CONT SPEC 4OZ STRL OR WHT (MISCELLANEOUS)
CORD BIPOLAR FORCEPS 12FT (ELECTRODE) ×2 IMPLANT
COVER SURGICAL LIGHT HANDLE (MISCELLANEOUS) ×2 IMPLANT
DERMABOND ADVANCED .7 DNX12 (GAUZE/BANDAGES/DRESSINGS) ×2 IMPLANT
DERMABOND ADVANCED .7 DNX6 (GAUZE/BANDAGES/DRESSINGS) IMPLANT
DRAIN JACKSON RD 7FR 3/32 (WOUND CARE) IMPLANT
DRAIN SNY 10 ROU (WOUND CARE) IMPLANT
DRAPE HALF SHEET 40X57 (DRAPES) IMPLANT
ELECT COATED BLADE 2.86 ST (ELECTRODE) ×2 IMPLANT
ELECT REM PT RETURN 9FT ADLT (ELECTROSURGICAL) ×1
ELECTRODE REM PT RTRN 9FT ADLT (ELECTROSURGICAL) ×2 IMPLANT
EVACUATOR SILICONE 100CC (DRAIN) ×2 IMPLANT
FORCEPS BIPOLAR SPETZLER 8 1.0 (NEUROSURGERY SUPPLIES) ×2 IMPLANT
GAUZE 4X4 16PLY ~~LOC~~+RFID DBL (SPONGE) ×2 IMPLANT
GLOVE BIO SURGEON STRL SZ7.5 (GLOVE) ×2 IMPLANT
GOWN STRL REUS W/ TWL LRG LVL3 (GOWN DISPOSABLE) ×4 IMPLANT
GOWN STRL REUS W/TWL LRG LVL3 (GOWN DISPOSABLE) ×2
HEMOSTAT SURGICEL 2X14 (HEMOSTASIS) ×2 IMPLANT
KIT BASIN OR (CUSTOM PROCEDURE TRAY) ×2 IMPLANT
KIT TURNOVER KIT B (KITS) ×2 IMPLANT
LOCATOR NERVE 3 VOLT (DISPOSABLE) IMPLANT
NDL HYPO 25GX1X1/2 BEV (NEEDLE) ×2 IMPLANT
NEEDLE HYPO 25GX1X1/2 BEV (NEEDLE) ×1 IMPLANT
NS IRRIG 1000ML POUR BTL (IV SOLUTION) ×2 IMPLANT
PAD ARMBOARD 7.5X6 YLW CONV (MISCELLANEOUS) ×4 IMPLANT
PENCIL SMOKE EVACUATOR (MISCELLANEOUS) ×2 IMPLANT
POSITIONER HEAD DONUT 9IN (MISCELLANEOUS) IMPLANT
SHEARS HARMONIC 9CM CVD (BLADE) ×2 IMPLANT
SPONGE INTESTINAL PEANUT (DISPOSABLE) IMPLANT
STAPLER VISISTAT 35W (STAPLE) ×2 IMPLANT
SUT ETHILON 2 0 FS 18 (SUTURE) ×2 IMPLANT
SUT SILK 3 0 REEL (SUTURE) ×2 IMPLANT
SUT VIC AB 3-0 SH 27 (SUTURE) ×1
SUT VIC AB 3-0 SH 27X BRD (SUTURE) ×2 IMPLANT
SUT VIC AB 4-0 PS2 27 (SUTURE) IMPLANT
SUT VICRYL 4-0 PS2 18IN ABS (SUTURE) ×2 IMPLANT
TOWEL GREEN STERILE FF (TOWEL DISPOSABLE) ×2 IMPLANT
TRAY ENT MC OR (CUSTOM PROCEDURE TRAY) ×2 IMPLANT
TUBE FEEDING 10FR FLEXIFLO (MISCELLANEOUS) IMPLANT

## 2022-04-13 NOTE — Progress Notes (Signed)
Pt with allergy listed to amoxicillin as "upset stomach". Pt states she has no other reaction to amoxicillin other than GI issues. Dr Redmond Baseman notified of this and that [facility] is ordered. Per Dr. Redmond Baseman, pt will need Ancef instead. Dr. Redmond Baseman also notified that patient states that she develops shingles when she receives any antibiotics.

## 2022-04-13 NOTE — Anesthesia Postprocedure Evaluation (Signed)
Anesthesia Post Note  Patient: Eileen Foster  Procedure(s) Performed: THYROID LOBECTOMY (Left: Neck)     Patient location during evaluation: PACU Anesthesia Type: General Level of consciousness: awake and alert, patient cooperative and oriented Pain management: pain level controlled Vital Signs Assessment: post-procedure vital signs reviewed and stable Respiratory status: spontaneous breathing, nonlabored ventilation and respiratory function stable Cardiovascular status: blood pressure returned to baseline and stable Postop Assessment: no apparent nausea or vomiting and adequate PO intake Anesthetic complications: no   No notable events documented.  Last Vitals:  Vitals:   04/13/22 1425 04/13/22 1440  BP: 123/79 135/79  Pulse: (!) 108 (!) 104  Resp: 11 19  Temp: 36.8 C   SpO2: 93% 94%    Last Pain:  Vitals:   04/13/22 1425  TempSrc:   PainSc: 1                  Melondy Blanchard,E. Kaylyne Axton

## 2022-04-13 NOTE — Transfer of Care (Signed)
Immediate Anesthesia Transfer of Care Note  Patient: Eileen Foster  Procedure(s) Performed: THYROID LOBECTOMY (Left: Neck)  Patient Location: PACU  Anesthesia Type:General  Level of Consciousness: awake and patient cooperative  Airway & Oxygen Therapy: Patient Spontanous Breathing and Patient connected to face mask oxygen  Post-op Assessment: Report given to RN and Post -op Vital signs reviewed and stable  Post vital signs: Reviewed and stable  Last Vitals:  Vitals Value Taken Time  BP 135/79 04/13/22 1430  Temp 36.8 C 04/13/22 1425  Pulse 100 04/13/22 1432  Resp 22 04/13/22 1432  SpO2 89 % 04/13/22 1432  Vitals shown include unvalidated device data.  Last Pain:  Vitals:   04/13/22 1028  TempSrc:   PainSc: 0-No pain         Complications: No notable events documented.

## 2022-04-13 NOTE — H&P (Signed)
Eileen Foster is an 41 y.o. female.   Chief Complaint: Thyroid nodule HPI: 41 year old female with enlarged left thyroid nodule who presents for surgical management.  Past Medical History:  Diagnosis Date   Anxiety    Hypertension    RA (rheumatoid arthritis) ([place])    Thyroid nodule     Past Surgical History:  Procedure Laterality Date   ADENOIDECTOMY  2003    History reviewed. No pertinent family history. Social History:  reports that she has never smoked. She has never used smokeless tobacco. She reports current alcohol use. She reports that she does not currently use drugs.  Allergies:  Allergies  Allergen Reactions   Meloxicam Rash   Almond (Diagnostic) Swelling    Swelling of uvula   Amoxicillin     REACTION: Upset Stomach    Medications Prior to Admission  Medication Sig Dispense Refill   ALPRAZolam (XANAX) 0.5 MG tablet TAKE 1 TABLET BY MOUTH EVERY DAY AS NEEDED 15 tablet 0   Brimonidine Tartrate (LUMIFY) 0.025 % SOLN Place 1 drop into both eyes daily.     cholecalciferol (VITAMIN D3) 25 MCG (1000 UNIT) tablet Take 1,000 Units by mouth daily.     DULoxetine (CYMBALTA) 30 MG capsule Take 30 mg by mouth daily.     leucovorin (WELLCOVORIN) 5 MG tablet Take 5 mg by mouth daily.     MAGNESIUM PO Take 1 tablet by mouth daily.     methotrexate 50 MG/2ML injection Inject 25 mg into the skin every 7 (seven) days.     ORENCIA CLICKJECT 540 MG/ML SOAJ Inject 125 mg into the skin once a week.     OVER THE COUNTER MEDICATION Take 2 capsules by mouth daily. Vitamin D3, Zinc, Elderberry & Echinacea Compound     Polyethyl Glycol-Propyl Glycol (SYSTANE OP) Place 1 drop into both eyes daily.     predniSONE (DELTASONE) 5 MG tablet Take 5-20 mg by mouth daily as needed (rheumatoid arthritis).     sulfaSALAzine (AZULFIDINE) 500 MG tablet Take 1,000 mg by mouth in the morning and at bedtime.     triamcinolone (NASACORT ALLERGY 24HR) 55 MCG/ACT AERO nasal inhaler Place 2 sprays into the  nose daily as needed (allergies).     valsartan (DIOVAN) 80 MG tablet Take 80 mg by mouth daily.     zinc gluconate 50 MG tablet Take 50 mg by mouth daily.     betamethasone dipropionate (DIPROLENE) 0.05 % cream Apply topically 2 (two) times daily. Apply once daily to external ear canal as needed (Patient not taking: Reported on 03/31/2022) 30 g 0   dexmethylphenidate (FOCALIN) 10 MG tablet take 3/4 tablet every morning and 1/2 tablet every afternoon. (Patient not taking: Reported on 03/31/2022) 38 tablet 0   tretinoin (RETIN-A) 0.05 % cream Apply 1 Application topically 3 (three) times a week.      Results for orders placed or performed during the hospital encounter of 04/13/22 (from the past 48 hour(s))  Basic metabolic panel per protocol     Status: None   Collection Time: 04/13/22 10:55 AM  Result Value Ref Range   Sodium 139 135 - 145 mmol/L   Potassium 3.8 3.5 - 5.1 mmol/L   Chloride 106 98 - 111 mmol/L   CO2 23 22 - 32 mmol/L   Glucose, Bld 94 70 - 99 mg/dL    Comment: Glucose reference range applies only to samples taken after fasting for at least 8 hours.   BUN 9 6 -  20 mg/dL   Creatinine, Ser 4.27 0.44 - 1.00 mg/dL   Calcium 9.8 8.9 - 06.2 mg/dL   GFR, Estimated >37 >62 mL/min    Comment: (NOTE) Calculated using the CKD-EPI Creatinine Equation (2021)    Anion gap 10 5 - 15    Comment: Performed at [facility], [address] [place], [place] [address]  CBC per protocol     Status: None   Collection Time: 04/13/22 10:55 AM  Result Value Ref Range   WBC 6.1 4.0 - 10.5 K/uL   RBC 4.32 3.87 - 5.11 MIL/uL   Hemoglobin 12.5 12.0 - 15.0 g/dL   HCT 76.1 60.7 - 37.1 %   MCV 88.9 80.0 - 100.0 fL   MCH 28.9 26.0 - 34.0 pg   MCHC 32.6 30.0 - 36.0 g/dL   RDW 06.2 69.4 - 85.4 %   Platelets 277 150 - 400 K/uL   nRBC 0.0 0.0 - 0.2 %    Comment: Performed at [facility], [address] [place], [place] [address]  Pregnancy, urine POC     Status: None   Collection  Time: 04/13/22 11:39 AM  Result Value Ref Range   Preg Test, Ur NEGATIVE NEGATIVE    Comment:        THE SENSITIVITY OF THIS METHODOLOGY IS >24 mIU/mL    No results found.  Review of Systems  All other systems reviewed and are negative.   Blood pressure (!) 163/95, pulse 90, temperature 97.9 F (36.6 C), temperature source Oral, resp. rate 20, height 5\' 3"  (1.6 m), weight 81.6 kg, last menstrual period 03/23/2022, SpO2 99 %. Physical Exam Constitutional:      Appearance: Normal appearance. She is normal weight.  HENT:     Head: Normocephalic and atraumatic.     Right Ear: External ear normal.     Left Ear: External ear normal.     Nose: Nose normal.     Mouth/Throat:     Mouth: Mucous membranes are moist.     Pharynx: Oropharynx is clear.  Eyes:     Extraocular Movements: Extraocular movements intact.     Conjunctiva/sclera: Conjunctivae normal.     Pupils: Pupils are equal, round, and reactive to light.  Neck:     Comments: Left thyroid mass. Cardiovascular:     Rate and Rhythm: Normal rate.  Pulmonary:     Effort: Pulmonary effort is normal.  Musculoskeletal:     Cervical back: Normal range of motion.  Skin:    General: Skin is warm and dry.  Neurological:     General: No focal deficit present.     Mental Status: She is alert and oriented to person, place, and time.  Psychiatric:        Mood and Affect: Mood normal.        Behavior: Behavior normal.        Thought Content: Thought content normal.        Judgment: Judgment normal.      Assessment/Plan Thyroid nodule  To OR for left thyroid lobectomy.  03/25/2022, MD 04/13/2022, 12:31 PM

## 2022-04-13 NOTE — Progress Notes (Signed)
   ENT Progress Note: s/p Procedure(s): THYROID LOBECTOMY   Subjective: Mild pain, no swelling  Objective: Vital signs in last 24 hours: Temp:  [97.5 F (36.4 C)-98.2 F (36.8 C)] 97.7 F (36.5 C) (10/04 1554) Pulse Rate:  [86-109] 92 (10/04 1554) Resp:  [11-20] 16 (10/04 1554) BP: (113-163)/(57-95) 118/72 (10/04 1554) SpO2:  [93 %-100 %] 100 % (10/04 1554) Weight:  [81.6 kg] 81.6 kg (10/04 1015) Weight change:     Intake/Output from previous day: No intake/output data recorded. Intake/Output this shift: Total I/O In: 1220 [P.O.:120; I.V.:1000; IV Piggyback:100] Out: 31 [Drains:29; Blood:50]  Labs: Recent Labs    04/13/22 1055  WBC 6.1  HGB 12.5  HCT 38.4  PLT 277   Recent Labs    04/13/22 1055  NA 139  K 3.8  CL 106  CO2 23  GLUCOSE 94  BUN 9  CALCIUM 9.8    Studies/Results: No results found.   PHYSICAL EXAM: Inc intact, No erythema or swelling  JP functional  Good voice and normal respiration   Assessment/Plan: Stable s/p Left hemi-thyroidectomy O/N Obs    Jerrell Belfast 04/13/2022, 5:15 PM

## 2022-04-13 NOTE — Anesthesia Preprocedure Evaluation (Signed)
Anesthesia Evaluation  Patient identified by MRN, date of birth, ID band Patient awake    Reviewed: Allergy & Precautions, H&P , NPO status , Patient's Chart, lab work & pertinent test results  Airway Mallampati: II  TM Distance: >3 FB Neck ROM: Full    Dental no notable dental hx.    Pulmonary neg pulmonary ROS   Pulmonary exam normal breath sounds clear to auscultation       Cardiovascular hypertension, Pt. on medications Normal cardiovascular exam Rhythm:Regular Rate:Normal     Neuro/Psych   Anxiety Depression    negative neurological ROS     GI/Hepatic negative GI ROS, Neg liver ROS,,,  Endo/Other  negative endocrine ROS    Renal/GU negative Renal ROS  negative genitourinary   Musculoskeletal negative musculoskeletal ROS (+)    Abdominal   Peds negative pediatric ROS (+)  Hematology negative hematology ROS (+)   Anesthesia Other Findings   Reproductive/Obstetrics negative OB ROS                             Anesthesia Physical Anesthesia Plan  ASA: 2  Anesthesia Plan: General   Post-op Pain Management: Ofirmev IV (intra-op)*   Induction: Intravenous  PONV Risk Score and Plan: 3 and Ondansetron, Dexamethasone, Midazolam and Treatment may vary due to age or medical condition  Airway Management Planned: Oral ETT  Additional Equipment:   Intra-op Plan:   Post-operative Plan: Extubation in OR  Informed Consent: I have reviewed the patients History and Physical, chart, labs and discussed the procedure including the risks, benefits and alternatives for the proposed anesthesia with the patient or authorized representative who has indicated his/her understanding and acceptance.     Dental advisory given  Plan Discussed with: CRNA and Surgeon  Anesthesia Plan Comments:        Anesthesia Quick Evaluation

## 2022-04-13 NOTE — Op Note (Signed)
Preop diagnosis: Left thyroid nodule Postop diagnosis: same Procedure: Left thyroid lobectomy Surgeon: Redmond Baseman Assist: RNFA Anesth: General and local with 1% lidocaine with 1:100,000 epinephrine Compl: None Findings: Large left-sided thyroid nodule Description:  After discussing risks, benefits, and alternatives, the patient was brought to the operative suite and placed on the operative table in the supine position.  Anesthesia was induced and the patient was intubated by the anesthesia team without difficulty.  The eyes were taped closed and a shoulder roll was placed.  The thyroid incision was marked with a marking pen and injected with local anesthetic.  The neck was prepped and draped in sterile fashion.  The incision was made with a 15 blade scalpel and extended through the subcutaneous and platysma layers with Bovie electrocautery.  The midline raphe of the strap muscles was divided and strap muscles retracted over the left lobe.  Dissection was then performed around the lobe using the harmonic scalpel includig division of the superior and inferior pedicles and dissection around the lateral aspect.  Much of the dissection around the nodule was performed with blunt, finger dissection.  The left strap muscles were partially divided to allow delivery of the nodule.  Ultimately, the large nodule was able to be delivered out of the neck.  Dissection continued around the deep aspect of the lobe.  Staying along the capsule, the dissection continued until the nodule was fully freed and removed.  The left strap muscles were partially divided to allow delivery of the nodule.  It was passed to nursing for pathology.  The recurrent laryngeal nerve and the parathyroid glands were not visualized.  Bleeding was controlled with Bipolar electrocautery after a Valsalva was given.  After irrigating copiously, a strip of Surgicell was laid in the depth of the wound.  A 7 Fr drain was placed in the depth of the wound and  secured at the skin using 2-0 Nylon.  The strap muscles were re-approximated and closed with a single 3-0 Vicryl suture.  The platysma layer was closed with 3-0 Vicryl in a simple, interrupted fashion.  The subcutaneous layer was similarly closed with 4-0 Vicryl and the skin was closed with glue.  Drapes were removed and the patient was cleaned off.  She was returned to anesthesia for wake-up, was extubated, and moved to the recovery room in stable condition.

## 2022-04-13 NOTE — Brief Op Note (Signed)
04/13/2022  2:16 PM  PATIENT:  Eileen Foster  41 y.o. female  PRE-OPERATIVE DIAGNOSIS:  thyroid nodule  POST-OPERATIVE DIAGNOSIS:  thyroid nodule  PROCEDURE:  Procedure(s): THYROID LOBECTOMY (Left)  SURGEON:  Surgeon(s) and Role:    * Melida Quitter, MD - Primary  PHYSICIAN ASSISTANT:   ASSISTANTS: RNFA   ANESTHESIA:   general  EBL:  50 mL   BLOOD ADMINISTERED:none  DRAINS: (7 Fr) Jackson-Pratt drain(s) with closed bulb suction in the neck    LOCAL MEDICATIONS USED:  LIDOCAINE   SPECIMEN:  Source of Specimen:  Left thyroid nodule  DISPOSITION OF SPECIMEN:  PATHOLOGY  COUNTS:  YES  TOURNIQUET:  * No tourniquets in log *  DICTATION: .Note written in EPIC  PLAN OF CARE: Admit for overnight observation  PATIENT DISPOSITION:  PACU - hemodynamically stable.   Delay start of Pharmacological VTE agent (>24hrs) due to surgical blood loss or risk of bleeding: no

## 2022-04-13 NOTE — Anesthesia Procedure Notes (Signed)
Procedure Name: Intubation Date/Time: 04/13/2022 12:53 PM  Performed by: Janene Harvey, CRNAPre-anesthesia Checklist: Patient identified, Emergency Drugs available, Suction available and Patient being monitored Patient Re-evaluated:Patient Re-evaluated prior to induction Oxygen Delivery Method: Circle system utilized Preoxygenation: Pre-oxygenation with 100% oxygen Induction Type: IV induction Ventilation: Mask ventilation without difficulty and Oral airway inserted - appropriate to patient size Laryngoscope Size: 4 and Mac Grade View: Grade I Tube type: Oral Tube size: 7.0 mm Number of attempts: 1 Airway Equipment and Method: Stylet and Oral airway Placement Confirmation: ETT inserted through vocal cords under direct vision, positive ETCO2 and breath sounds checked- equal and bilateral Secured at: 22 cm Tube secured with: Tape Dental Injury: Teeth and Oropharynx as per pre-operative assessment

## 2022-04-14 ENCOUNTER — Encounter (HOSPITAL_COMMUNITY): Payer: Self-pay | Admitting: Otolaryngology

## 2022-04-14 DIAGNOSIS — E041 Nontoxic single thyroid nodule: Secondary | ICD-10-CM | POA: Diagnosis not present

## 2022-04-14 NOTE — Discharge Summary (Signed)
Physician Discharge Summary  Patient ID: Eileen Foster MRN: [id number] DOB/AGE: 12-14-1980 41 y.o.  Admit date: 04/13/2022 Discharge date: 04/14/2022  Admission Diagnoses: thyroid nodule  Discharge Diagnoses:  Principal Problem:   Thyroid nodule   Discharged Condition: good  Hospital Course: 41 year old female with large left thyroid nodule presented for surgical management.  See operative note.  She was observed overnight with drain in place and did quite well.  She was felt stable for discharge on POD 1 after drain removal.  Consults: None  Significant Diagnostic Studies: None  Treatments: surgery: Left thyroid lobectomy  Discharge Exam: Blood pressure 122/69, pulse 85, temperature 97.9 F (36.6 C), temperature source Oral, resp. rate 18, height 5\' 3"  (1.6 m), weight 81.6 kg, last menstrual period 03/23/2022, SpO2 100 %. General appearance: alert, cooperative, and no distress Neck: thyroid incision clean and intact, no fluid collection, drain removed, normal voice  Disposition: Discharge disposition: 01-Home or Self Care       Discharge Instructions     Diet - low sodium heart healthy   Complete by: As directed    Discharge instructions   Complete by: As directed    OK to shower incision, gently pat dry.  Do not apply ointment to incision.  Normal diet.  Avoid strenuous activity.  Treat pain with Tylenol and/or Motrin.   Increase activity slowly   Complete by: As directed    No wound care   Complete by: As directed       Allergies as of 04/14/2022       Reactions   Meloxicam Rash   Almond (diagnostic) Swelling   Swelling of uvula   Amoxicillin    REACTION: Upset Stomach        Medication List     TAKE these medications    ALPRAZolam 0.5 MG tablet Commonly known as: XANAX TAKE 1 TABLET BY MOUTH EVERY DAY AS NEEDED   betamethasone dipropionate 0.05 % cream Apply topically 2 (two) times daily. Apply once daily to external ear canal as needed    cholecalciferol 25 MCG (1000 UNIT) tablet Commonly known as: VITAMIN D3 Take 1,000 Units by mouth daily.   dexmethylphenidate 10 MG tablet Commonly known as: FOCALIN take 3/4 tablet every morning and 1/2 tablet every afternoon.   DULoxetine 30 MG capsule Commonly known as: CYMBALTA Take 30 mg by mouth daily.   leucovorin 5 MG tablet Commonly known as: WELLCOVORIN Take 5 mg by mouth daily.   Lumify 0.025 % Soln Generic drug: Brimonidine Tartrate Place 1 drop into both eyes daily.   MAGNESIUM PO Take 1 tablet by mouth daily.   methotrexate 50 MG/2ML injection Inject 25 mg into the skin every 7 (seven) days.   Nasacort Allergy 24HR 55 MCG/ACT Aero nasal inhaler Generic drug: triamcinolone Place 2 sprays into the nose daily as needed (allergies).   Orencia ClickJect 867 MG/ML Soaj Generic drug: Abatacept Inject 125 mg into the skin once a week.   OVER THE COUNTER MEDICATION Take 2 capsules by mouth daily. Vitamin D3, Zinc, Elderberry & Echinacea Compound   predniSONE 5 MG tablet Commonly known as: DELTASONE Take 5-20 mg by mouth daily as needed (rheumatoid arthritis).   sulfaSALAzine 500 MG tablet Commonly known as: AZULFIDINE Take 1,000 mg by mouth in the morning and at bedtime.   SYSTANE OP Place 1 drop into both eyes daily.   tretinoin 0.05 % cream Commonly known as: RETIN-A Apply 1 Application topically 3 (three) times a week.  valsartan 80 MG tablet Commonly known as: DIOVAN Take 80 mg by mouth daily.   zinc gluconate 50 MG tablet Take 50 mg by mouth daily.        Follow-up Information     Christia Reading, MD. Schedule an appointment as soon as possible for a visit in 1 week(s).   Specialty: Otolaryngology Contact information: [address] [place] [address] [phone]                 Signed: Christia Reading 04/14/2022, 8:04 AM

## 2022-04-19 LAB — SURGICAL PATHOLOGY

## 2024-02-08 NOTE — H&P (Signed)
 Subjective  Patient ID: Eileen Foster is a 43 y.o. female.  HPI  Returns for follow up discussion breast reduction. Current 36/38 DDD. Reports several year history neck back shoulder pain. She has tried specialty fitted bras, OTC pain medication, regular activity/weight loss, massage,heating pads for over 3 month trial without effect. Denies rashes.  Wt- patient was on Wegovy in bast and lost 25 lb. She has been off this medication for over a year with weight stable. This weight loss did not improve the pain described above.  MMG 12/2023 [facility] demonstrated asymmetry that was felt to be benign on diagnostic MMG/US  as below. 6 mo follow up US  recommended (January 2026)  PMH significant for RA, on Rinvoq, MTX, sulfasalazine . Uses steroids as needed for flares, last 2-3 months ago.   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 Can do this from home. Lives with spouse who is a Designer, multimedia. Three kids, two in college and youngest starting 9th grade.  Impression  IMPRESSION: Benign-appearing findings as described above within the right breast. There is no evidence of malignancy.  RECOMMENDATION: Right breast ultrasound at the lower outer quadrant in 6 months to assess stability. Patient was informed of these findings.  ACR BI-RADS CATEGORY 3 -- Probably Benign Letter Sent: Diagnostic Follow-up  Electronically Signed by: Dorrie Porch, MD on 01/10/2024 10:48 AM Narrative  EXAM: RIGHT DIGITAL DIAGNOSTIC MAMMOGRAM with CAD With 3-D Tomosynthesis  EXAM: RIGHT BREAST ULTRASOUND  INDICATION: Callback from screening mammogram  TECHNIQUE: Right spot compression views and true lateral view. Digital breast tomosynthesis (3-D mammography) was performed. This mammogram was analyzed by a CAD (Computer-Aided Detection) system. Right breast ultrasound.  COMPARISON: Screening mammogram of 01/01/2024 with old exam of 12/26/2022  TISSUE DENSITY: There are scattered areas of fibroglandular  density.  FINDINGS: Focal density is present within the upper-outer quadrant of the right breast. Additional smaller focal density present within the lower outer quadrant of the right breast. Additional views of the right breast reveal upper outer quadrant density much less well-defined with similar appearance to old exam suggesting a combination of overlapping and focal fibroglandular tissue. Smaller density within the lower outer quadrant persists.  Focused sonographic evaluation of the right breast reveals no cystic or solid lesion within the upper-outer quadrant. This is consistent with focal fibroglandular tissue. At the lower outer quadrant is a hypoechoic focus with smooth margins measuring 7 x 4 x 5 mm. Central increased echogenicity is present. Overall appearance is benign, suggestive of lymph node. This corresponds to mammographic density.  Review of Systems  Musculoskeletal: Positive for arthralgias, back pain and neck pain.  All other systems reviewed and are negative.  Objective  Physical Exam  Cardiovascular: Normal rate, regular rhythm and normal heart sounds.   Pulmonary/Chest Effort normal and breath sounds normal.   Skin  Fitzpatrick 2   Lymph: no palpable axillary adenopathy  +shoulder grooving Breasts: no palpable masses, grade 3 ptosis bilateral SN to nipple R 32 L 31  BW R 24 L 24 cm Nipple to IMF R 9.5 L 11 cm  Assessment/Plan  Macromastia Chronic neck and back pain  Her immunosuppressant use places her at higher risk wound healing problems and/or infection. Patient notes prior to past surgeries she has held her biologic medication and I asked that she discuss with her Rheumatologist holding Rinvoq, She has held this for over 2 weeks at this time.   Chronic neck and back pain in setting of macromastia that has failed conservative management over 3 month  trial. The pain is not related to other diagnoses.There is a reasonable likelihood that the patient's  symptoms are primarily due to macromastia. Breast reduction is likely to result in improvement of the chronic pain.  Reviewed reduction with anchor type scars, OP surgery, drains, post operative visits and limitations, recovery. Diminished sensation nipple and breast skin, risk of nipple loss, wound healing problems, asymmetry, incidental carcinoma, changes with wt gain/loss, aging, unacceptable cosmetic appearance reviewed. Reviewed scar maturation over months, risks hyper or hypopigmented scar. Reviewed cannot assure cup size. Reviewed that this is not a permanent operation.   Additional risks including but not limited to bleeding, hematoma, seroma, infection, wound healing problems, asymmetry need for additional procedures, damage to adjacent structures, unacceptable cosmetic result, blood clots in legs or lungs reviewed. Completed ASPS consent for breast reduction.  Drain teaching completed. Rx for tramadol given.  Anticipate >500 g resection from each breast  Earlis Ranks, MD [facility]  Office/ physician access line after hours [phone]

## 2024-02-16 ENCOUNTER — Encounter (HOSPITAL_BASED_OUTPATIENT_CLINIC_OR_DEPARTMENT_OTHER): Payer: Self-pay | Admitting: *Deleted

## 2024-02-19 ENCOUNTER — Other Ambulatory Visit: Payer: Self-pay

## 2024-02-19 ENCOUNTER — Encounter (HOSPITAL_BASED_OUTPATIENT_CLINIC_OR_DEPARTMENT_OTHER): Payer: Self-pay | Admitting: Plastic Surgery

## 2024-02-22 ENCOUNTER — Encounter (HOSPITAL_BASED_OUTPATIENT_CLINIC_OR_DEPARTMENT_OTHER)
Admission: RE | Admit: 2024-02-22 | Discharge: 2024-02-22 | Disposition: A | Discharge status: Home / Self Care | Source: Ambulatory Visit | Attending: Plastic Surgery | Admitting: Plastic Surgery

## 2024-02-22 DIAGNOSIS — M542 Cervicalgia: Secondary | ICD-10-CM | POA: Diagnosis present

## 2024-02-22 DIAGNOSIS — M549 Dorsalgia, unspecified: Secondary | ICD-10-CM | POA: Diagnosis present

## 2024-02-22 DIAGNOSIS — R008 Other abnormalities of heart beat: Secondary | ICD-10-CM | POA: Insufficient documentation

## 2024-02-22 DIAGNOSIS — R55 Syncope and collapse: Secondary | ICD-10-CM | POA: Diagnosis not present

## 2024-02-22 DIAGNOSIS — I10 Essential (primary) hypertension: Secondary | ICD-10-CM | POA: Insufficient documentation

## 2024-02-22 DIAGNOSIS — Y838 Other surgical procedures as the cause of abnormal reaction of the patient, or of later complication, without mention of misadventure at the time of the procedure: Secondary | ICD-10-CM | POA: Diagnosis not present

## 2024-02-22 DIAGNOSIS — M069 Rheumatoid arthritis, unspecified: Secondary | ICD-10-CM | POA: Diagnosis not present

## 2024-02-22 DIAGNOSIS — Z01810 Encounter for preprocedural cardiovascular examination: Secondary | ICD-10-CM | POA: Insufficient documentation

## 2024-02-22 DIAGNOSIS — F419 Anxiety disorder, unspecified: Secondary | ICD-10-CM | POA: Diagnosis not present

## 2024-02-22 DIAGNOSIS — N62 Hypertrophy of breast: Secondary | ICD-10-CM | POA: Diagnosis present

## 2024-02-22 DIAGNOSIS — I491 Atrial premature depolarization: Secondary | ICD-10-CM | POA: Insufficient documentation

## 2024-02-22 DIAGNOSIS — G8929 Other chronic pain: Secondary | ICD-10-CM | POA: Diagnosis not present

## 2024-02-22 DIAGNOSIS — N6489 Other specified disorders of breast: Secondary | ICD-10-CM | POA: Diagnosis not present

## 2024-02-22 DIAGNOSIS — L7632 Postprocedural hematoma of skin and subcutaneous tissue following other procedure: Secondary | ICD-10-CM | POA: Diagnosis not present

## 2024-02-22 DIAGNOSIS — N6021 Fibroadenosis of right breast: Secondary | ICD-10-CM | POA: Diagnosis not present

## 2024-02-22 DIAGNOSIS — N6022 Fibroadenosis of left breast: Secondary | ICD-10-CM | POA: Diagnosis not present

## 2024-02-22 MED ORDER — CHLORHEXIDINE GLUCONATE CLOTH 2 % EX PADS: 6.0000 | MEDICATED_PAD | Freq: Once | CUTANEOUS | Status: DC

## 2024-02-22 NOTE — Progress Notes (Signed)

## 2024-02-23 ENCOUNTER — Emergency Department (HOSPITAL_COMMUNITY): Admitting: Anesthesiology

## 2024-02-23 ENCOUNTER — Inpatient Hospital Stay: Admit: 2024-02-23 | Admitting: Plastic Surgery

## 2024-02-23 ENCOUNTER — Other Ambulatory Visit: Payer: Self-pay

## 2024-02-23 ENCOUNTER — Ambulatory Visit (HOSPITAL_BASED_OUTPATIENT_CLINIC_OR_DEPARTMENT_OTHER): Admitting: Anesthesiology

## 2024-02-23 ENCOUNTER — Encounter (HOSPITAL_BASED_OUTPATIENT_CLINIC_OR_DEPARTMENT_OTHER): Payer: Self-pay | Admitting: Plastic Surgery

## 2024-02-23 ENCOUNTER — Ambulatory Visit (HOSPITAL_BASED_OUTPATIENT_CLINIC_OR_DEPARTMENT_OTHER)
Admission: RE | Admit: 2024-02-23 | Discharge: 2024-02-23 | Disposition: A | Discharge status: Home / Self Care | Attending: Plastic Surgery | Admitting: Plastic Surgery

## 2024-02-23 ENCOUNTER — Encounter (HOSPITAL_COMMUNITY): Payer: Self-pay | Admitting: Emergency Medicine

## 2024-02-23 ENCOUNTER — Encounter (HOSPITAL_COMMUNITY)
Admission: EM | Disposition: A | Discharge status: Home / Self Care | Payer: Self-pay | Source: Home / Self Care | Attending: Emergency Medicine

## 2024-02-23 ENCOUNTER — Encounter (HOSPITAL_BASED_OUTPATIENT_CLINIC_OR_DEPARTMENT_OTHER)
Admission: RE | Disposition: A | Discharge status: Home / Self Care | Payer: Self-pay | Source: Home / Self Care | Attending: Plastic Surgery

## 2024-02-23 ENCOUNTER — Ambulatory Visit (HOSPITAL_COMMUNITY)
Admission: EM | Admit: 2024-02-23 | Discharge: 2024-02-23 | Disposition: A | Discharge status: Home / Self Care | Source: Home / Self Care | Attending: Emergency Medicine | Admitting: Emergency Medicine

## 2024-02-23 DIAGNOSIS — Y838 Other surgical procedures as the cause of abnormal reaction of the patient, or of later complication, without mention of misadventure at the time of the procedure: Secondary | ICD-10-CM | POA: Insufficient documentation

## 2024-02-23 DIAGNOSIS — L7632 Postprocedural hematoma of skin and subcutaneous tissue following other procedure: Secondary | ICD-10-CM | POA: Insufficient documentation

## 2024-02-23 DIAGNOSIS — I10 Essential (primary) hypertension: Secondary | ICD-10-CM | POA: Insufficient documentation

## 2024-02-23 DIAGNOSIS — R55 Syncope and collapse: Secondary | ICD-10-CM | POA: Insufficient documentation

## 2024-02-23 DIAGNOSIS — N6489 Other specified disorders of breast: Secondary | ICD-10-CM

## 2024-02-23 DIAGNOSIS — M542 Cervicalgia: Secondary | ICD-10-CM | POA: Insufficient documentation

## 2024-02-23 DIAGNOSIS — F419 Anxiety disorder, unspecified: Secondary | ICD-10-CM | POA: Insufficient documentation

## 2024-02-23 DIAGNOSIS — N6022 Fibroadenosis of left breast: Secondary | ICD-10-CM | POA: Insufficient documentation

## 2024-02-23 DIAGNOSIS — M069 Rheumatoid arthritis, unspecified: Secondary | ICD-10-CM | POA: Insufficient documentation

## 2024-02-23 DIAGNOSIS — N6021 Fibroadenosis of right breast: Secondary | ICD-10-CM | POA: Insufficient documentation

## 2024-02-23 DIAGNOSIS — G8929 Other chronic pain: Secondary | ICD-10-CM | POA: Insufficient documentation

## 2024-02-23 DIAGNOSIS — N62 Hypertrophy of breast: Secondary | ICD-10-CM | POA: Insufficient documentation

## 2024-02-23 DIAGNOSIS — M549 Dorsalgia, unspecified: Secondary | ICD-10-CM | POA: Insufficient documentation

## 2024-02-23 DIAGNOSIS — Z01818 Encounter for other preprocedural examination: Secondary | ICD-10-CM

## 2024-02-23 HISTORY — DX: Headache, unspecified: R51.9

## 2024-02-23 HISTORY — PX: HEMATOMA EVACUATION: SHX5118

## 2024-02-23 HISTORY — PX: BREAST REDUCTION SURGERY: SHX8

## 2024-02-23 LAB — TYPE AND SCREEN
ABO/RH(D): A POS
Antibody Screen: NEGATIVE

## 2024-02-23 LAB — CBC WITH DIFFERENTIAL/PLATELET
Abs Immature Granulocytes: 0.04 K/uL (ref 0.00–0.07)
Basophils Absolute: 0 K/uL (ref 0.0–0.1)
Basophils Relative: 0 %
Eosinophils Absolute: 0 K/uL (ref 0.0–0.5)
Eosinophils Relative: 0 %
HCT: 30.7 % — ABNORMAL LOW (ref 36.0–46.0)
Hemoglobin: 9.3 g/dL — ABNORMAL LOW (ref 12.0–15.0)
Immature Granulocytes: 0 %
Lymphocytes Relative: 5 %
Lymphs Abs: 0.6 K/uL — ABNORMAL LOW (ref 0.7–4.0)
MCH: 30.6 pg (ref 26.0–34.0)
MCHC: 30.3 g/dL (ref 30.0–36.0)
MCV: 101 fL — ABNORMAL HIGH (ref 80.0–100.0)
Monocytes Absolute: 0.6 K/uL (ref 0.1–1.0)
Monocytes Relative: 6 %
Neutro Abs: 9.9 K/uL — ABNORMAL HIGH (ref 1.7–7.7)
Neutrophils Relative %: 89 %
Platelets: 212 K/uL (ref 150–400)
RBC: 3.04 MIL/uL — ABNORMAL LOW (ref 3.87–5.11)
RDW: 14 % (ref 11.5–15.5)
WBC: 11.2 K/uL — ABNORMAL HIGH (ref 4.0–10.5)
nRBC: 0 % (ref 0.0–0.2)

## 2024-02-23 LAB — COMPREHENSIVE METABOLIC PANEL WITH GFR
ALT: 16 U/L (ref 0–44)
AST: 26 U/L (ref 15–41)
Albumin: 3.7 g/dL (ref 3.5–5.0)
Alkaline Phosphatase: 29 U/L — ABNORMAL LOW (ref 38–126)
Anion gap: 12 (ref 5–15)
BUN: 7 mg/dL (ref 6–20)
CO2: 18 mmol/L — ABNORMAL LOW (ref 22–32)
Calcium: 8.3 mg/dL — ABNORMAL LOW (ref 8.9–10.3)
Chloride: 108 mmol/L (ref 98–111)
Creatinine, Ser: 0.83 mg/dL (ref 0.44–1.00)
GFR, Estimated: >60 mL/min (ref >60)
Glucose, Bld: 216 mg/dL — ABNORMAL HIGH (ref 70–99)
Potassium: 3.7 mmol/L (ref 3.5–5.1)
Sodium: 138 mmol/L (ref 135–145)
Total Bilirubin: 0.5 mg/dL (ref 0.0–1.2)
Total Protein: 6 g/dL — ABNORMAL LOW (ref 6.5–8.1)

## 2024-02-23 SURGERY — EVACUATION HEMATOMA
Anesthesia: General | Laterality: Right

## 2024-02-23 SURGERY — MAMMOPLASTY, REDUCTION
Anesthesia: General | Site: Breast | Laterality: Bilateral

## 2024-02-23 MED ORDER — PHENYLEPHRINE 80 MCG/ML (10ML) SYRINGE FOR IV PUSH (FOR BLOOD PRESSURE SUPPORT)
PREFILLED_SYRINGE | INTRAVENOUS | Status: DC | PRN
Start: 2024-02-23 — End: 2024-02-23
  Administered 2024-02-23: 80 ug via INTRAVENOUS
  Administered 2024-02-23: 160 ug via INTRAVENOUS
  Administered 2024-02-23: 80 ug via INTRAVENOUS
  Administered 2024-02-23: 160 ug via INTRAVENOUS
  Administered 2024-02-23 (×2): 80 ug via INTRAVENOUS

## 2024-02-23 MED ORDER — ROCURONIUM BROMIDE 100 MG/10ML IV SOLN
INTRAVENOUS | Status: DC | PRN
  Administered 2024-02-23: 50 mg via INTRAVENOUS

## 2024-02-23 MED ORDER — FENTANYL CITRATE (PF) 250 MCG/5ML IJ SOLN
INTRAMUSCULAR | Status: DC | PRN
  Administered 2024-02-23 (×2): 25 ug via INTRAVENOUS
  Administered 2024-02-23 (×2): 50 ug via INTRAVENOUS

## 2024-02-23 MED ORDER — ONDANSETRON HCL 4 MG/2ML IJ SOLN: 4.0000 mg | Freq: Four times a day (QID) | INTRAMUSCULAR | Status: DC | PRN

## 2024-02-23 MED ORDER — SODIUM CHLORIDE 0.9 % IV SOLN
INTRAVENOUS | Status: DC | PRN
Start: 2024-02-23 — End: 2024-02-23

## 2024-02-23 MED ORDER — HYDROMORPHONE HCL 1 MG/ML IJ SOLN
INTRAMUSCULAR | Status: AC
  Filled 2024-02-23: qty 0.5

## 2024-02-23 MED ORDER — OXYCODONE HCL 5 MG PO TABS: 5.0000 mg | ORAL_TABLET | Freq: Once | ORAL | Status: DC | PRN

## 2024-02-23 MED ORDER — TRANEXAMIC ACID 1000 MG/10ML IV SOLN
INTRAVENOUS | Status: DC | PRN
  Administered 2024-02-23: 1000 mg via TOPICAL

## 2024-02-23 MED ORDER — MIDAZOLAM HCL 5 MG/5ML IJ SOLN
INTRAMUSCULAR | Status: DC | PRN
  Administered 2024-02-23: 2 mg via INTRAVENOUS

## 2024-02-23 MED ORDER — KETAMINE HCL 50 MG/5ML IJ SOSY
PREFILLED_SYRINGE | INTRAMUSCULAR | Status: AC
  Filled 2024-02-23: qty 5

## 2024-02-23 MED ORDER — ONDANSETRON HCL 4 MG/2ML IJ SOLN
INTRAMUSCULAR | Status: AC
  Filled 2024-02-23: qty 2

## 2024-02-23 MED ORDER — FENTANYL CITRATE (PF) 100 MCG/2ML IJ SOLN
INTRAMUSCULAR | Status: AC
  Filled 2024-02-23: qty 2

## 2024-02-23 MED ORDER — ACETAMINOPHEN 500 MG PO TABS
1000.0000 mg | ORAL_TABLET | ORAL | Status: AC
  Administered 2024-02-23: 1000 mg via ORAL

## 2024-02-23 MED ORDER — HEMOSTATIC AGENTS (NO CHARGE) OPTIME
TOPICAL | Status: DC | PRN
  Administered 2024-02-23: 1 via TOPICAL

## 2024-02-23 MED ORDER — DEXAMETHASONE SODIUM PHOSPHATE 10 MG/ML IJ SOLN
INTRAMUSCULAR | Status: DC | PRN
  Administered 2024-02-23: 4 mg via INTRAVENOUS

## 2024-02-23 MED ORDER — PHENYLEPHRINE HCL-NACL 20-0.9 MG/250ML-% IV SOLN
INTRAVENOUS | Status: DC | PRN
  Administered 2024-02-23: 25 ug/min via INTRAVENOUS

## 2024-02-23 MED ORDER — PHENYLEPHRINE 80 MCG/ML (10ML) SYRINGE FOR IV PUSH (FOR BLOOD PRESSURE SUPPORT)
PREFILLED_SYRINGE | INTRAVENOUS | Status: AC
  Filled 2024-02-23: qty 30

## 2024-02-23 MED ORDER — TRANEXAMIC ACID-NACL 1000-0.7 MG/100ML-% IV SOLN
INTRAVENOUS | Status: AC
  Filled 2024-02-23: qty 100

## 2024-02-23 MED ORDER — ALBUMIN HUMAN 5 % IV SOLN: INTRAVENOUS | Status: DC | PRN

## 2024-02-23 MED ORDER — SUCCINYLCHOLINE CHLORIDE 200 MG/10ML IV SOSY
PREFILLED_SYRINGE | INTRAVENOUS | Status: DC | PRN
  Administered 2024-02-23: 100 mg via INTRAVENOUS

## 2024-02-23 MED ORDER — HYDROMORPHONE HCL 1 MG/ML IJ SOLN
INTRAMUSCULAR | Status: DC | PRN
  Administered 2024-02-23: .5 mg via INTRAVENOUS

## 2024-02-23 MED ORDER — LACTATED RINGERS IV SOLN: INTRAVENOUS | Status: DC

## 2024-02-23 MED ORDER — SUCCINYLCHOLINE CHLORIDE 200 MG/10ML IV SOSY
PREFILLED_SYRINGE | INTRAVENOUS | Status: AC
  Filled 2024-02-23: qty 10

## 2024-02-23 MED ORDER — GLYCOPYRROLATE PF 0.2 MG/ML IJ SOSY
PREFILLED_SYRINGE | INTRAMUSCULAR | Status: AC
Start: 2024-02-23 — End: 2024-02-23
  Filled 2024-02-23: qty 1

## 2024-02-23 MED ORDER — ACETAMINOPHEN 500 MG PO TABS
ORAL_TABLET | ORAL | Status: AC
  Filled 2024-02-23: qty 2

## 2024-02-23 MED ORDER — EPHEDRINE 5 MG/ML INJ
INTRAVENOUS | Status: AC
  Filled 2024-02-23: qty 5

## 2024-02-23 MED ORDER — LIDOCAINE 2% (20 MG/ML) 5 ML SYRINGE
INTRAMUSCULAR | Status: AC
  Filled 2024-02-23: qty 10

## 2024-02-23 MED ORDER — FENTANYL CITRATE (PF) 100 MCG/2ML IJ SOLN
INTRAMUSCULAR | Status: DC | PRN
  Administered 2024-02-23 (×4): 50 ug via INTRAVENOUS

## 2024-02-23 MED ORDER — BUPIVACAINE HCL (PF) 0.5 % IJ SOLN
INTRAMUSCULAR | Status: DC | PRN
  Administered 2024-02-23: 30 mL

## 2024-02-23 MED ORDER — OXYCODONE HCL 5 MG/5ML PO SOLN: 5.0000 mg | Freq: Once | ORAL | Status: DC | PRN

## 2024-02-23 MED ORDER — DEXAMETHASONE SODIUM PHOSPHATE 10 MG/ML IJ SOLN
INTRAMUSCULAR | Status: DC | PRN
Start: 2024-02-23 — End: 2024-02-23
  Administered 2024-02-23: 10 mg via INTRAVENOUS

## 2024-02-23 MED ORDER — PROPOFOL 10 MG/ML IV BOLUS
INTRAVENOUS | Status: DC | PRN
  Administered 2024-02-23: 180 mg via INTRAVENOUS

## 2024-02-23 MED ORDER — CELECOXIB 200 MG PO CAPS
200.0000 mg | ORAL_CAPSULE | ORAL | Status: AC
  Administered 2024-02-23: 200 mg via ORAL

## 2024-02-23 MED ORDER — GABAPENTIN 300 MG PO CAPS
300.0000 mg | ORAL_CAPSULE | ORAL | Status: AC
  Administered 2024-02-23: 300 mg via ORAL

## 2024-02-23 MED ORDER — TRANEXAMIC ACID 1000 MG/10ML IV SOLN
2000.0000 mg | INTRAVENOUS | Status: DC
  Filled 2024-02-23: qty 20

## 2024-02-23 MED ORDER — PROPOFOL 10 MG/ML IV BOLUS
INTRAVENOUS | Status: AC
Start: 2024-02-23 — End: 2024-02-23
  Filled 2024-02-23: qty 20

## 2024-02-23 MED ORDER — PROPOFOL 500 MG/50ML IV EMUL
INTRAVENOUS | Status: DC | PRN
  Administered 2024-02-23: 50 ug/kg/min via INTRAVENOUS

## 2024-02-23 MED ORDER — CEFAZOLIN SODIUM-DEXTROSE 2-4 GM/100ML-% IV SOLN
INTRAVENOUS | Status: AC
  Filled 2024-02-23: qty 100

## 2024-02-23 MED ORDER — MIDAZOLAM HCL 2 MG/2ML IJ SOLN
INTRAMUSCULAR | Status: AC
  Filled 2024-02-23: qty 2

## 2024-02-23 MED ORDER — GLYCOPYRROLATE PF 0.2 MG/ML IJ SOSY
PREFILLED_SYRINGE | INTRAMUSCULAR | Status: DC | PRN
  Administered 2024-02-23: .1 mg via INTRAVENOUS

## 2024-02-23 MED ORDER — GABAPENTIN 300 MG PO CAPS
ORAL_CAPSULE | ORAL | Status: AC
  Filled 2024-02-23: qty 1

## 2024-02-23 MED ORDER — GLYCOPYRROLATE 0.2 MG/ML IJ SOLN
INTRAMUSCULAR | Status: DC | PRN
  Administered 2024-02-23: .2 mg via INTRAVENOUS

## 2024-02-23 MED ORDER — ONDANSETRON HCL 4 MG/2ML IJ SOLN
INTRAMUSCULAR | Status: AC
Start: 2024-02-23 — End: 2024-02-23
  Filled 2024-02-23: qty 4

## 2024-02-23 MED ORDER — SODIUM CHLORIDE 0.9 % IV BOLUS
1000.0000 mL | Freq: Once | INTRAVENOUS | Status: AC
  Administered 2024-02-23: 1000 mL via INTRAVENOUS

## 2024-02-23 MED ORDER — 0.9 % SODIUM CHLORIDE (POUR BTL) OPTIME
TOPICAL | Status: DC | PRN
  Administered 2024-02-23: 1000 mL

## 2024-02-23 MED ORDER — CELECOXIB 200 MG PO CAPS
ORAL_CAPSULE | ORAL | Status: AC
  Filled 2024-02-23: qty 1

## 2024-02-23 MED ORDER — DEXAMETHASONE SODIUM PHOSPHATE 10 MG/ML IJ SOLN
INTRAMUSCULAR | Status: AC
Start: 2024-02-23 — End: 2024-02-23
  Filled 2024-02-23: qty 2

## 2024-02-23 MED ORDER — LIDOCAINE 2% (20 MG/ML) 5 ML SYRINGE
INTRAMUSCULAR | Status: DC | PRN
  Administered 2024-02-23: 60 mg via INTRAVENOUS

## 2024-02-23 MED ORDER — PROPOFOL 10 MG/ML IV BOLUS
INTRAVENOUS | Status: DC | PRN
  Administered 2024-02-23: 200 mg via INTRAVENOUS

## 2024-02-23 MED ORDER — FENTANYL CITRATE (PF) 100 MCG/2ML IJ SOLN: 25.0000 ug | INTRAMUSCULAR | Status: DC | PRN

## 2024-02-23 MED ORDER — 0.9 % SODIUM CHLORIDE (POUR BTL) OPTIME
TOPICAL | Status: DC | PRN
  Administered 2024-02-23 (×2): 1000 mL

## 2024-02-23 MED ORDER — ONDANSETRON HCL 4 MG/2ML IJ SOLN
INTRAMUSCULAR | Status: DC | PRN
  Administered 2024-02-23: 4 mg via INTRAVENOUS

## 2024-02-23 MED ORDER — DEXAMETHASONE SODIUM PHOSPHATE 10 MG/ML IJ SOLN
INTRAMUSCULAR | Status: AC
  Filled 2024-02-23: qty 1

## 2024-02-23 MED ORDER — EPHEDRINE SULFATE-NACL 50-0.9 MG/10ML-% IV SOSY
PREFILLED_SYRINGE | INTRAVENOUS | Status: DC | PRN
  Administered 2024-02-23 (×4): 2.5 mg via INTRAVENOUS

## 2024-02-23 MED ORDER — CEFAZOLIN SODIUM-DEXTROSE 2-3 GM-%(50ML) IV SOLR
INTRAVENOUS | Status: DC | PRN
Start: 2024-02-23 — End: 2024-02-23
  Administered 2024-02-23: 2 g via INTRAVENOUS

## 2024-02-23 MED ORDER — KETAMINE HCL 10 MG/ML IJ SOLN
INTRAMUSCULAR | Status: DC | PRN
  Administered 2024-02-23: 40 mg via INTRAVENOUS

## 2024-02-23 MED ORDER — LACTATED RINGERS IV SOLN: INTRAVENOUS | Status: DC | PRN

## 2024-02-23 MED ORDER — CEFAZOLIN SODIUM-DEXTROSE 2-4 GM/100ML-% IV SOLN
2.0000 g | INTRAVENOUS | Status: AC
  Administered 2024-02-23: 2 g via INTRAVENOUS

## 2024-02-23 MED ORDER — EPHEDRINE 5 MG/ML INJ
INTRAVENOUS | Status: AC
  Filled 2024-02-23: qty 20

## 2024-02-23 MED ORDER — PROPOFOL 10 MG/ML IV BOLUS
INTRAVENOUS | Status: AC
  Filled 2024-02-23: qty 20

## 2024-02-23 MED ORDER — SUGAMMADEX SODIUM 200 MG/2ML IV SOLN
INTRAVENOUS | Status: DC | PRN
  Administered 2024-02-23: 200 mg via INTRAVENOUS

## 2024-02-23 MED ORDER — LIDOCAINE HCL (CARDIAC) PF 100 MG/5ML IV SOSY
PREFILLED_SYRINGE | INTRAVENOUS | Status: DC | PRN
  Administered 2024-02-23: 60 mg via INTRAVENOUS

## 2024-02-23 MED ORDER — GLYCOPYRROLATE PF 0.2 MG/ML IJ SOSY
PREFILLED_SYRINGE | INTRAMUSCULAR | Status: AC
  Filled 2024-02-23: qty 1

## 2024-02-23 MED ORDER — FENTANYL CITRATE (PF) 100 MCG/2ML IJ SOLN
INTRAMUSCULAR | Status: AC
Start: 2024-02-23 — End: 2024-02-23
  Filled 2024-02-23: qty 2

## 2024-02-23 MED ORDER — FENTANYL CITRATE (PF) 250 MCG/5ML IJ SOLN
INTRAMUSCULAR | Status: AC
  Filled 2024-02-23: qty 5

## 2024-02-23 MED ORDER — CEFAZOLIN SODIUM 1 G IJ SOLR
INTRAMUSCULAR | Status: AC
Start: 2024-02-23 — End: 2024-02-23
  Filled 2024-02-23: qty 20

## 2024-02-23 MED ORDER — LIDOCAINE 2% (20 MG/ML) 5 ML SYRINGE
INTRAMUSCULAR | Status: AC
  Filled 2024-02-23: qty 5

## 2024-02-23 MED ORDER — EPHEDRINE SULFATE (PRESSORS) 50 MG/ML IJ SOLN
INTRAMUSCULAR | Status: DC | PRN
  Administered 2024-02-23 (×2): 5 mg via INTRAVENOUS

## 2024-02-23 SURGICAL SUPPLY — 45 items
BINDER BREAST 3XL (GAUZE/BANDAGES/DRESSINGS) IMPLANT
BINDER BREAST LRG (GAUZE/BANDAGES/DRESSINGS) IMPLANT
BINDER BREAST MEDIUM (GAUZE/BANDAGES/DRESSINGS) IMPLANT
BINDER BREAST XLRG (GAUZE/BANDAGES/DRESSINGS) IMPLANT
BINDER BREAST XXLRG (GAUZE/BANDAGES/DRESSINGS) IMPLANT
BLADE SURG 10 STRL SS (BLADE) ×8 IMPLANT
BNDG GAUZE DERMACEA FLUFF 4 (GAUZE/BANDAGES/DRESSINGS) ×4 IMPLANT
CANISTER SUCT 1200ML W/VALVE (MISCELLANEOUS) ×2 IMPLANT
CHLORAPREP W/TINT 26 (MISCELLANEOUS) ×4 IMPLANT
COVER BACK TABLE 60X90IN (DRAPES) ×2 IMPLANT
COVER MAYO STAND STRL (DRAPES) ×2 IMPLANT
DERMABOND ADVANCED .7 DNX12 (GAUZE/BANDAGES/DRESSINGS) ×4 IMPLANT
DRAIN CHANNEL 15F RND FF W/TCR (WOUND CARE) IMPLANT
DRAIN CHANNEL 19F RND (DRAIN) IMPLANT
DRAPE TOP ARMCOVERS (MISCELLANEOUS) ×2 IMPLANT
DRAPE U-SHAPE 76X120 STRL (DRAPES) ×2 IMPLANT
DRAPE UTILITY XL STRL (DRAPES) ×2 IMPLANT
ELECT COATED BLADE 2.86 ST (ELECTRODE) ×2 IMPLANT
ELECTRODE REM PT RTRN 9FT ADLT (ELECTROSURGICAL) ×2 IMPLANT
EVACUATOR SILICONE 100CC (DRAIN) IMPLANT
GAUZE PAD ABD 8X10 STRL (GAUZE/BANDAGES/DRESSINGS) ×4 IMPLANT
GLOVE BIO SURGEON STRL SZ 6 (GLOVE) ×4 IMPLANT
GOWN STRL REUS W/ TWL LRG LVL3 (GOWN DISPOSABLE) ×4 IMPLANT
MARKER SKIN DUAL TIP RULER LAB (MISCELLANEOUS) IMPLANT
NDL HYPO 25X1 1.5 SAFETY (NEEDLE) ×2 IMPLANT
NEEDLE HYPO 25X1 1.5 SAFETY (NEEDLE) ×1 IMPLANT
NS IRRIG 1000ML POUR BTL (IV SOLUTION) ×2 IMPLANT
PACK BASIN DAY SURGERY FS (CUSTOM PROCEDURE TRAY) ×2 IMPLANT
PENCIL SMOKE EVACUATOR (MISCELLANEOUS) ×2 IMPLANT
PIN SAFETY STERILE (MISCELLANEOUS) ×2 IMPLANT
SHEET MEDIUM DRAPE 40X70 STRL (DRAPES) ×2 IMPLANT
SLEEVE SCD COMPRESS KNEE MED (STOCKING) ×2 IMPLANT
SPONGE T-LAP 18X18 ~~LOC~~+RFID (SPONGE) ×6 IMPLANT
STAPLER SKIN PROX WIDE 3.9 (STAPLE) ×2 IMPLANT
SUT ETHILON 2 0 FS 18 (SUTURE) IMPLANT
SUT MNCRL AB 3-0 PS2 18 (SUTURE) IMPLANT
SUT MNCRL AB 3-0 PS2 27 (SUTURE) IMPLANT
SUT MNCRL AB 4-0 PS2 18 (SUTURE) IMPLANT
SUT VIC AB 3-0 PS1 18XBRD (SUTURE) IMPLANT
SYR BULB IRRIG 60ML STRL (SYRINGE) ×2 IMPLANT
SYR CONTROL 10ML LL (SYRINGE) ×2 IMPLANT
TOWEL GREEN STERILE FF (TOWEL DISPOSABLE) ×4 IMPLANT
TUBE CONNECTING 20X1/4 (TUBING) ×2 IMPLANT
UNDERPAD 30X36 HEAVY ABSORB (UNDERPADS AND DIAPERS) ×4 IMPLANT
YANKAUER SUCT BULB TIP NO VENT (SUCTIONS) ×2 IMPLANT

## 2024-02-23 SURGICAL SUPPLY — 30 items
BAG COUNTER SPONGE SURGICOUNT (BAG) ×2 IMPLANT
BINDER BREAST XLRG (GAUZE/BANDAGES/DRESSINGS) IMPLANT
CANISTER SUCTION 3000ML PPV (SUCTIONS) ×2 IMPLANT
COVER SURGICAL LIGHT HANDLE (MISCELLANEOUS) ×2 IMPLANT
DERMABOND ADVANCED .7 DNX6 (GAUZE/BANDAGES/DRESSINGS) IMPLANT
DRAPE IMP U-DRAPE 54X76 (DRAPES) ×2 IMPLANT
DRAPE LAPAROTOMY 100X72 PEDS (DRAPES) ×2 IMPLANT
ELECT CAUTERY BLADE 6.4 (BLADE) ×2 IMPLANT
ELECT COATED BLADE 2.86 ST (ELECTRODE) ×2 IMPLANT
ELECTRODE REM PT RTRN 9FT ADLT (ELECTROSURGICAL) ×2 IMPLANT
GAUZE PAD ABD 8X10 STRL (GAUZE/BANDAGES/DRESSINGS) ×2 IMPLANT
GAUZE SPONGE 4X4 12PLY STRL (GAUZE/BANDAGES/DRESSINGS) ×2 IMPLANT
GLOVE BIO SURGEON STRL SZ 6 (GLOVE) ×2 IMPLANT
GLOVE SURG SS PI 6.0 STRL IVOR (GLOVE) ×2 IMPLANT
GOWN STRL REUS W/ TWL LRG LVL3 (GOWN DISPOSABLE) ×4 IMPLANT
HEMOSTAT POWDER SURGIFOAM 1G (HEMOSTASIS) IMPLANT
KIT BASIN OR (CUSTOM PROCEDURE TRAY) ×2 IMPLANT
KIT TURNOVER KIT B (KITS) ×2 IMPLANT
NS IRRIG 1000ML POUR BTL (IV SOLUTION) ×2 IMPLANT
PACK GENERAL/GYN (CUSTOM PROCEDURE TRAY) ×2 IMPLANT
PACK UNIVERSAL I (CUSTOM PROCEDURE TRAY) ×2 IMPLANT
PAD ABD 8X10 STRL (GAUZE/BANDAGES/DRESSINGS) IMPLANT
PAD ARMBOARD POSITIONER FOAM (MISCELLANEOUS) ×4 IMPLANT
SUT ETHILON 2 0 FS 18 (SUTURE) IMPLANT
SUT MNCRL AB 3-0 PS2 18 (SUTURE) IMPLANT
SUT MON AB 3-0 SH27 (SUTURE) IMPLANT
SUT VIC AB 3-0 PS1 18X BRD (SUTURE) IMPLANT
TOWEL GREEN STERILE (TOWEL DISPOSABLE) ×2 IMPLANT
TOWEL GREEN STERILE FF (TOWEL DISPOSABLE) ×2 IMPLANT
UNDERPAD 30X36 HEAVY ABSORB (UNDERPADS AND DIAPERS) ×2 IMPLANT

## 2024-02-23 NOTE — ED Provider Notes (Signed)
 [place] EMERGENCY DEPARTMENT AT [facility] Provider Note   CSN: [id number] Arrival date & time: 02/23/24  2014     Patient presents with: Loss of Consciousness   Eileen Foster is a 43 y.o. female.   Pt is a 43 yo female with pmhx significant for htn, RA, and anxiety.  Pt had a breast reduction today.  She had a lot of drainage from her JP drain on the right.  The right breast is more swollen.  She did call her plastic surgeon (Dr. Arelia) and was going to her office to be seen, but then had a syncopal event, so EMS was called.       Prior to Admission medications   Medication Sig Start Date End Date Taking? Authorizing Provider  ALPRAZolam  (XANAX ) 0.5 MG tablet TAKE 1 TABLET BY MOUTH EVERY DAY AS NEEDED 01/14/14   O'Sullivan, Melissa, NP  Brimonidine  Tartrate (LUMIFY ) 0.025 % SOLN Place 1 drop into both eyes daily.    [provider]  cholecalciferol (VITAMIN D3) 25 MCG (1000 UNIT) tablet Take 1,000 Units by mouth daily.    [provider]  DULoxetine  (CYMBALTA ) 30 MG capsule Take 30 mg by mouth daily. 01/19/22   [provider]  leucovorin  (WELLCOVORIN ) 5 MG tablet Take 5 mg by mouth daily. 03/10/22   [provider]  MAGNESIUM PO Take 1 tablet by mouth daily.    [provider]  methotrexate 50 MG/2ML injection Inject 25 mg into the skin every 7 (seven) days. 03/18/22   [provider]  OVER THE COUNTER MEDICATION Take 2 capsules by mouth daily. Vitamin D3, Zinc, Elderberry & Echinacea Compound    [provider]  Polyethyl Glycol-Propyl Glycol (SYSTANE OP) Place 1 drop into both eyes daily.    [provider]  predniSONE (DELTASONE) 5 MG tablet Take 5-20 mg by mouth daily as needed (rheumatoid arthritis). 02/09/22   [provider]  sulfaSALAzine  (AZULFIDINE ) 500 MG tablet Take 1,000 mg by mouth in the morning and at bedtime.    [provider]  tretinoin (RETIN-A) 0.05 % cream  Apply 1 Application topically 3 (three) times a week. 03/22/22   [provider]  triamcinolone  (NASACORT  ALLERGY 24HR) 55 MCG/ACT AERO nasal inhaler Place 2 sprays into the nose daily as needed (allergies).    [provider]  Upadacitinib ER (RINVOQ) 15 MG TB24 Take by mouth.    [provider]  valsartan (DIOVAN) 80 MG tablet Take 80 mg by mouth daily. 01/19/22   [provider]  zinc gluconate 50 MG tablet Take 50 mg by mouth daily.    [provider]    Allergies: Meloxicam, Almond (diagnostic), Amoxicillin, and Minocycline    Review of Systems  Hematological:        Increased output in JP drain  All other systems reviewed and are negative.   Updated Vital Signs BP 125/74   Pulse 76   Temp 97.8 F (36.6 C) (Oral)   Resp 18   LMP 03/23/2022 (Approximate)   SpO2 98%   Physical Exam Vitals and nursing note reviewed.  Constitutional:      Appearance: Normal appearance.  HENT:     Head: Normocephalic and atraumatic.     Right Ear: External ear normal.     Left Ear: External ear normal.     Nose: Nose normal.     Mouth/Throat:     Mouth: Mucous membranes are moist.     Pharynx: Oropharynx  is clear.  Eyes:     Extraocular Movements: Extraocular movements intact.     Conjunctiva/sclera: Conjunctivae normal.     Pupils: Pupils are equal, round, and reactive to light.  Cardiovascular:     Rate and Rhythm: Normal rate and regular rhythm.     Pulses: Normal pulses.     Heart sounds: Normal heart sounds.  Pulmonary:     Effort: Pulmonary effort is normal.     Breath sounds: Normal breath sounds.  Chest:     Comments: Right breast swollen Abdominal:     General: Abdomen is flat. Bowel sounds are normal.     Palpations: Abdomen is soft.  Musculoskeletal:        General: Normal range of motion.     Cervical back: Normal range of motion and neck supple.  Skin:    General: Skin is warm.     Capillary Refill: Capillary refill takes  less than 2 seconds.  Neurological:     General: No focal deficit present.     Mental Status: She is alert and oriented to person, place, and time.  Psychiatric:        Mood and Affect: Mood normal.        Behavior: Behavior normal.     (all labs ordered are listed, but only abnormal results are displayed) Labs Reviewed  COMPREHENSIVE METABOLIC PANEL WITH GFR  CBC WITH DIFFERENTIAL/PLATELET  TYPE AND SCREEN    EKG: None  Radiology: No results found.   Procedures   Medications Ordered in the ED  sodium chloride  0.9 % bolus 1,000 mL (1,000 mLs Intravenous New Bag/Given 02/23/24 2038)                                    Medical Decision Making Amount and/or Complexity of Data Reviewed Labs: ordered.  Risk Decision regarding hospitalization.   This patient presents to the ED for concern of increased output in JP drain, this involves an extensive number of treatment options, and is a complaint that carries with it a high risk of complications and morbidity.  The differential diagnosis includes hematoma   Co morbidities that complicate the patient evaluation  htn, RA, and anxiety   Additional history obtained:  Additional history obtained from epic chart review External records from outside source obtained and reviewed including EMS report   Lab Tests:  Labs ordered, but are pending when she went to the OR   Cardiac Monitoring:  The patient was maintained on a cardiac monitor.  I personally viewed and interpreted the cardiac monitored which showed an underlying rhythm of: NSR   Medicines ordered and prescription drug management:  I ordered medication including ivfs  for sx  Reevaluation of the patient after these medicines showed that the patient improved I have reviewed the patients home medicines and have made adjustments as needed  Consultations Obtained:  I requested consultation with the plastic surgeon (Dr. Arelia),  and discussed lab and  imaging findings as well as pertinent plan -she will take pt to the OR  Problem List / ED Course:  Right breast hematoma:  to OR for evacuation of hematoma.  Syncope likely due to blood loss and vasovagal rxn.   Reevaluation:  After the interventions noted above, I reevaluated the patient and found that they have :improved   Social Determinants of Health:  Lives at home   Dispostion:  After consideration of the  diagnostic results and the patients response to treatment, I feel that the patent would benefit from admission.       Final diagnoses:  Syncope, unspecified syncope type  Breast hematoma    ED Discharge Orders     None          Dean Clarity, MD 02/23/24 [phone]

## 2024-02-23 NOTE — ED Triage Notes (Signed)
 Pt coming in from home for syncope. Pt had recent breast reduction and having increasing output from drains

## 2024-02-23 NOTE — Interval H&P Note (Signed)
 History and Physical Interval Note:  02/23/2024 10:26 AM  Eileen Foster  has presented today for surgery, with the diagnosis of MACROMASTIA.  The various methods of treatment have been discussed with the patient and family. After consideration of risks, benefits and other options for treatment, the patient has consented to  Procedure(s): MAMMOPLASTY, REDUCTION (Bilateral) as a surgical intervention.  The patient's history has been reviewed, patient examined, no change in status, stable for surgery.  I have reviewed the patient's chart and labs.  Questions were answered to the patient's satisfaction.     [name]

## 2024-02-23 NOTE — Transfer of Care (Signed)
 Immediate Anesthesia Transfer of Care Note  Patient: Eileen Foster  Procedure(s) Performed: EVACUATION HEMATOMA (Right)  Patient Location: PACU  Anesthesia Type:General  Level of Consciousness: awake and alert   Airway & Oxygen Therapy: Patient Spontanous Breathing and Patient connected to nasal cannula oxygen  Post-op Assessment: Report given to RN and Post -op Vital signs reviewed and stable  Post vital signs: Reviewed and stable  Last Vitals:  Vitals Value Taken Time  BP 137/77   Temp    Pulse 106 02/23/24 22:28  Resp 22 02/23/24 22:28  SpO2 100 % 02/23/24 22:28  Vitals shown include unfiled device data.  Last Pain:  Vitals:   02/23/24 2017  TempSrc: Oral  PainSc:          Complications: No notable events documented.

## 2024-02-23 NOTE — Anesthesia Procedure Notes (Signed)
 Procedure Name: Intubation Date/Time: 02/23/2024 9:05 PM  Performed by: Jama Powell NOVAK, CRNAPre-anesthesia Checklist: Patient identified, Timeout performed, Emergency Drugs available, Suction available and Patient being monitored Patient Re-evaluated:Patient Re-evaluated prior to induction Oxygen Delivery Method: Circle system utilized Preoxygenation: Pre-oxygenation with 100% oxygen Induction Type: IV induction and Rapid sequence Laryngoscope Size: Mac and 3 Grade View: Grade I Tube type: Oral Tube size: 7.0 mm Number of attempts: 1 Airway Equipment and Method: Stylet Placement Confirmation: ETT inserted through vocal cords under direct vision, positive ETCO2, CO2 detector and breath sounds checked- equal and bilateral Secured at: 21 cm Tube secured with: Tape Dental Injury: Teeth and Oropharynx as per pre-operative assessment

## 2024-02-23 NOTE — Anesthesia Procedure Notes (Signed)
 Procedure Name: Intubation Date/Time: 02/23/2024 11:14 AM  Performed by: Frost Kayla MATSU, CRNAPre-anesthesia Checklist: Patient identified, Emergency Drugs available, Suction available and Patient being monitored Patient Re-evaluated:Patient Re-evaluated prior to induction Oxygen Delivery Method: Circle system utilized Preoxygenation: Pre-oxygenation with 100% oxygen Induction Type: IV induction Ventilation: Mask ventilation without difficulty Laryngoscope Size: Miller and 3 Grade View: Grade I Tube type: Oral Tube size: 7.0 mm Number of attempts: 1 Airway Equipment and Method: Stylet and Oral airway Placement Confirmation: ETT inserted through vocal cords under direct vision, positive ETCO2 and breath sounds checked- equal and bilateral Secured at: 21 cm Tube secured with: Tape Dental Injury: Teeth and Oropharynx as per pre-operative assessment

## 2024-02-23 NOTE — H&P (Signed)
 Subjective  Patient ID: Eileen Foster is a 43 y.o. female.  HPI  POD# 0 from breast reduction bilateral. Patient discharged to home earlier today form [facility]. Called approximately 1.5 hr ago to report right breast drain filling rapidly and right breast swelling. She subsequently fainted at home and EMS called.   PMH significant for RA, on Rinvoq, MTX, sulfasalazine . Uses steroids as needed for flares, last 2-3 months ago.   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 Can do this from home. Lives with spouse who is a Designer, multimedia. Three kids, two in college and youngest starting 9th grade.  Objective  Physical Exam  Cardiovascular: Normal rate, regular rhythm and normal heart sounds.   Pulmonary/Chest Effort normal and breath sounds normal.   Gen: alert oriented NAD  +shoulder grooving Breasts: left breast benign incisions intact Right breast firm consistent with hematoma and JP bulb full  Assessment/Plan  S/p breast reduction Right breast hematoma  Plan OR this pm for evacuation hematoma. Discussed with patient and husband at bedside.   Earlis Ranks, MD [facility]   Office/ physician access line after hours [phone]

## 2024-02-23 NOTE — Anesthesia Postprocedure Evaluation (Signed)
 Anesthesia Post Note  Patient: Eileen Foster  Procedure(s) Performed: EVACUATION HEMATOMA (Right)     Patient location during evaluation: PACU Anesthesia Type: General Level of consciousness: awake and alert Pain management: pain level controlled Vital Signs Assessment: post-procedure vital signs reviewed and stable Respiratory status: spontaneous breathing, nonlabored ventilation, respiratory function stable and patient connected to nasal cannula oxygen Cardiovascular status: blood pressure returned to baseline and stable Postop Assessment: no apparent nausea or vomiting Anesthetic complications: no   No notable events documented.  Last Vitals:  Vitals:   02/23/24 2245 02/23/24 2300  BP: (!) 142/86 135/81  Pulse: 98 87  Resp: 18 17  Temp:    SpO2: 100% 100%    Last Pain:  Vitals:   02/23/24 2300  TempSrc:   PainSc: 0-No pain                 Epifanio Lamar BRAVO

## 2024-02-23 NOTE — Discharge Instructions (Signed)
 May take Tylenol after 4pm, if needed.    Post Anesthesia Home Care Instructions  Activity: Get plenty of rest for the remainder of the day. A responsible individual must stay with you for 24 hours following the procedure.  For the next 24 hours, DO NOT: -Drive a car -Advertising copywriter -Drink alcoholic beverages -Take any medication unless instructed by your physician -Make any legal decisions or sign important papers.  Meals: Start with liquid foods such as gelatin or soup. Progress to regular foods as tolerated. Avoid greasy, spicy, heavy foods. If nausea and/or vomiting occur, drink only clear liquids until the nausea and/or vomiting subsides. Call your physician if vomiting continues.  Special Instructions/Symptoms: Your throat may feel dry or sore from the anesthesia or the breathing tube placed in your throat during surgery. If this causes discomfort, gargle with warm salt water. The discomfort should disappear within 24 hours.  If you had a scopolamine patch placed behind your ear for the management of post- operative nausea and/or vomiting:  1. The medication in the patch is effective for 72 hours, after which it should be removed.  Wrap patch in a tissue and discard in the trash. Wash hands thoroughly with soap and water. 2. You may remove the patch earlier than 72 hours if you experience unpleasant side effects which may include dry mouth, dizziness or visual disturbances. 3. Avoid touching the patch. Wash your hands with soap and water after contact with the patch.     About my Jackson-Pratt Bulb Drain  What is a Jackson-Pratt bulb? A Jackson-Pratt is a soft, round device used to collect drainage. It is connected to a long, thin drainage catheter, which is held in place by one or two small stiches near your surgical incision site. When the bulb is squeezed, it forms a vacuum, forcing the drainage to empty into the bulb.  Emptying the Jackson-Pratt bulb- To empty the  bulb: 1. Release the plug on the top of the bulb. 2. Pour the bulb's contents into a measuring container which your nurse will provide. 3. Record the time emptied and amount of drainage. Empty the drain(s) as often as your     doctor or nurse recommends.  Date                  Time                    Amount (Drain 1)                 Amount (Drain 2)  _____________________________________________________________________  _____________________________________________________________________  _____________________________________________________________________  _____________________________________________________________________  _____________________________________________________________________  _____________________________________________________________________  _____________________________________________________________________  _____________________________________________________________________  Squeezing the Jackson-Pratt Bulb- To squeeze the bulb: 1. Make sure the plug at the top of the bulb is open. 2. Squeeze the bulb tightly in your fist. You will hear air squeezing from the bulb. 3. Replace the plug while the bulb is squeezed. 4. Use a safety pin to attach the bulb to your clothing. This will keep the catheter from     pulling at the bulb insertion site.  When to call your doctor- Call your doctor if: Drain site becomes red, swollen or hot. You have a fever greater than 101 degrees F. There is oozing at the drain site. Drain falls out (apply a guaze bandage over the drain hole and secure it with tape). Drainage increases daily not related to activity patterns. (You will usually have more drainage when you are active than when you  are resting.) Drainage has a bad odor.

## 2024-02-23 NOTE — Op Note (Signed)
 Operative Note   DATE OF OPERATION: 8.15.2025  LOCATION: [facility]-outpatient  SURGICAL DIVISION: Plastic Surgery  PREOPERATIVE DIAGNOSES:  1. Macromastia 2. Chronic neck and back pain   POSTOPERATIVE DIAGNOSES:  same  PROCEDURE:  Bilateral breast reduction  SURGEON: Earlis Ranks MD MBA  ASSISTANT: none  ANESTHESIA:  General.   EBL: 300 ml   COMPLICATIONS: None immediate.   INDICATIONS FOR PROCEDURE:  The patient, Eileen Foster, is a 43 y.o. female born on 02-20-1981, is here for treatment chronic neck and back pain in setting of macromastia that has failed conservative measures.   FINDINGS: Right reduction 476 g Left reduction 494 g  DESCRIPTION OF PROCEDURE:  The patient was marked standing in the preoperative area to mark sternal notch, chest midline, anterior axillary lines, inframammary folds. The location of new nipple areolar complex was marked at level of on inframammary fold on anterior surface breast by palpation. This was marked symmetric over bilateral breasts. With aid of Wise pattern marker, location of new nipple areolar complex and vertical limbs (7 cm) were marked by displacement of breasts along meridian. The patient was taken to the operating room. SCDs were placed and IV antibiotics were given. The patient's operative site was prepped and draped in a sterile fashion. A time out was performed and all information was confirmed to be correct.     I began on left breast. Over left breast, superior medial pedicle marked and nipple areolar complex incised with 45 mm diameter marker. Pedicle deepithlialized and developed to chest wall. Pedicle developed to chest wall. Breast tissue resected over lower pole. Medial and lateral flaps developed. Additional superior and lateral breast tissue excised. Breast tailor tacked closed.   I then directed attention to right breast where superior medial pedicle designed. NAC incised with 45 mm diameter marker. The pedicle  was deepithelialized. Pedicle developed to chest wall. Breast tissue resected over lower pole. Medial and lateral flaps developed. Additional superior and lateral breast tissue excised. Breast tailor tacked closed. Patient brought to upright sitting position and assessed for symmetry. Patient returned to supine position. Breast cavities irrigated and hemostasis obtained. Local anesthetic infiltrated throughout each breast. 15 Fr JP placed in each breast and secured with 2-0 nylon. Closure completed bilateral with interrupted and short running 3-0 vicryl used to approximate dermis along remainder inframammary fold and vertical limb. NAC inset with 3-0 vicryl in dermis. Skin closure completed with 4-0 monocryl subcuticular throughout vertical limbs and NAC. Skin closure completed with 3-0 monocryl along each IMF. Tissue adhesive applied. Dry dressing and breast binder applied.  The patient was allowed to wake from anesthesia, extubated and taken to the recovery room in satisfactory condition.   SPECIMENS: right and left breast reduction  DRAINS: 15 Fr JP in right and left breast  Earlis Ranks, MD [facility]  Office/ physician access line after hours [phone]

## 2024-02-23 NOTE — Anesthesia Preprocedure Evaluation (Addendum)
 Anesthesia Evaluation  Patient identified by MRN, date of birth, ID band Patient awake    Reviewed: Allergy & Precautions, H&P , NPO status , Patient's Chart, lab work & pertinent test results  Airway Mallampati: II   Neck ROM: full    Dental   Pulmonary neg pulmonary ROS   breath sounds clear to auscultation       Cardiovascular hypertension,  Rhythm:regular Rate:Normal     Neuro/Psych  Headaches PSYCHIATRIC DISORDERS Anxiety Depression       GI/Hepatic   Endo/Other    Renal/GU      Musculoskeletal  (+) Arthritis , Rheumatoid disorders,    Abdominal   Peds  Hematology   Anesthesia Other Findings   Reproductive/Obstetrics                              Anesthesia Physical Anesthesia Plan  ASA: 2  Anesthesia Plan: General   Post-op Pain Management:    Induction: Intravenous  PONV Risk Score and Plan: 3 and Ondansetron , Dexamethasone , Midazolam  and Treatment may vary due to age or medical condition  Airway Management Planned: Oral ETT  Additional Equipment:   Intra-op Plan:   Post-operative Plan: Extubation in OR  Informed Consent: I have reviewed the patients History and Physical, chart, labs and discussed the procedure including the risks, benefits and alternatives for the proposed anesthesia with the patient or authorized representative who has indicated his/her understanding and acceptance.     Dental advisory given  Plan Discussed with: CRNA, Anesthesiologist and Surgeon  Anesthesia Plan Comments:         Anesthesia Quick Evaluation

## 2024-02-23 NOTE — Anesthesia Preprocedure Evaluation (Addendum)
 Anesthesia Evaluation  Patient identified by MRN, date of birth, ID band Patient awake    Reviewed: Allergy & Precautions, H&P , NPO status , Patient's Chart, lab work & pertinent test resultsPreop documentation limited or incomplete due to emergent nature of procedure.  Airway Mallampati: II   Neck ROM: full    Dental   Pulmonary neg pulmonary ROS   breath sounds clear to auscultation       Cardiovascular hypertension,  Rhythm:regular Rate:Normal     Neuro/Psych  Headaches PSYCHIATRIC DISORDERS Anxiety Depression       GI/Hepatic   Endo/Other    Renal/GU      Musculoskeletal  (+) Arthritis , Rheumatoid disorders,    Abdominal   Peds  Hematology   Anesthesia Other Findings   Reproductive/Obstetrics                              Anesthesia Physical Anesthesia Plan  ASA: 2 and emergent  Anesthesia Plan: General   Post-op Pain Management:    Induction: Intravenous  PONV Risk Score and Plan: 3 and Ondansetron , Dexamethasone , Midazolam  and Treatment may vary due to age or medical condition  Airway Management Planned: Oral ETT  Additional Equipment:   Intra-op Plan:   Post-operative Plan: Extubation in OR  Informed Consent: I have reviewed the patients History and Physical, chart, labs and discussed the procedure including the risks, benefits and alternatives for the proposed anesthesia with the patient or authorized representative who has indicated his/her understanding and acceptance.     Dental advisory given  Plan Discussed with: CRNA, Anesthesiologist and Surgeon  Anesthesia Plan Comments:          Anesthesia Quick Evaluation

## 2024-02-23 NOTE — Op Note (Signed)
 Operative Note   DATE OF OPERATION: 8.15.2025  LOCATION: [facility]-outpatient  SURGICAL DIVISION: Plastic Surgery  PREOPERATIVE DIAGNOSES:  1. Hematoma right breast 2. S/p breast reduction  POSTOPERATIVE DIAGNOSES:  same  PROCEDURE:  Evacuation hematoma  SURGEON: Earlis Ranks MD MBA  ASSISTANT: none  ANESTHESIA:  General.   EBL: 250 ml clot evacuated  COMPLICATIONS: None immediate.   INDICATIONS FOR PROCEDURE:  The patient, Eileen Foster, is a 43 y.o. female born on 11-03-1980, is here for treatment hematoma right breast.   FINDINGS: No active arterial source bleeding noted. Generalized Generalized oozing from breast tissue noted.   DESCRIPTION OF PROCEDURE:  The patient's operative site was marked with the patient in the preoperative area. The patient was taken to the operating room. SCDs were placed and IV antibiotics were given. The patient's operative site was prepped and draped in a sterile fashion. A time out was performed and all information was confirmed to be correct.  Incision made in inframammary fold scar and cavity entered. Clot evacuated. Cavity irrigated with saline. Tissue cauterized . Tranexamic acid  soaked sponge placed in cavity. Cavity again inspected for hemostasis. Surgicel cellulose powder applied to wound. 19 Fr JP placed in cavity and secured with 2-0 nylon. Closure completed with interrupted and short running 3-0 vicryl and 3-0 monocryl used to approximate dermis. Skin closure completed with 3-0 monocryl along IMF. Tissue adhesive applied. Dry dressing and breast binder applied.   The patient was allowed to wake from anesthesia, extubated and taken to the recovery room in satisfactory condition.   SPECIMENS: none  DRAINS: 19 Fr JP in right breast  Earlis Ranks, MD [facility]astic [facility] Re[facility] Surgery  Office/ physician access line after hours [phone]

## 2024-02-23 NOTE — Transfer of Care (Signed)
 Immediate Anesthesia Transfer of Care Note  Patient: Eileen Foster  Procedure(s) Performed: MAMMOPLASTY, REDUCTION (Bilateral: Breast)  Patient Location: PACU  Anesthesia Type:General  Level of Consciousness: awake, alert , oriented, drowsy, and patient cooperative  Airway & Oxygen Therapy: Patient Spontanous Breathing and Patient connected to face mask oxygen  Post-op Assessment: Report given to RN and Post -op Vital signs reviewed and stable  Post vital signs: Reviewed and stable  Last Vitals:  Vitals Value Taken Time  BP 148/91 02/23/24 14:24  Temp    Pulse 99 02/23/24 14:28  Resp 19 02/23/24 14:28  SpO2 97 % 02/23/24 14:28  Vitals shown include unfiled device data.  Last Pain:  Vitals:   02/23/24 1007  TempSrc: Temporal  PainSc: 0-No pain      Patients Stated Pain Goal: 3 (02/23/24 1007)  Complications: No notable events documented.

## 2024-02-24 ENCOUNTER — Encounter (HOSPITAL_COMMUNITY): Payer: Self-pay | Admitting: Plastic Surgery

## 2024-02-24 ENCOUNTER — Other Ambulatory Visit: Payer: Self-pay

## 2024-02-24 NOTE — Anesthesia Postprocedure Evaluation (Signed)
 Anesthesia Post Note  Patient: Eileen Foster  Procedure(s) Performed: MAMMOPLASTY, REDUCTION (Bilateral: Breast)     Patient location during evaluation: PACU Anesthesia Type: General Level of consciousness: awake and alert Pain management: pain level controlled Vital Signs Assessment: post-procedure vital signs reviewed and stable Respiratory status: spontaneous breathing, nonlabored ventilation, respiratory function stable and patient connected to nasal cannula oxygen Cardiovascular status: blood pressure returned to baseline and stable Postop Assessment: no apparent nausea or vomiting Anesthetic complications: no   No notable events documented.  Last Vitals:  Vitals:   02/23/24 1445 02/23/24 1527  BP: (!) 159/99 (!) 148/81  Pulse: (!) 103 (!) 108  Resp: 18 18  Temp:  36.7 C  SpO2: 99% 95%    Last Pain:  Vitals:   02/23/24 1527  TempSrc: Temporal  PainSc:                  [name] S

## 2024-02-25 ENCOUNTER — Encounter (HOSPITAL_BASED_OUTPATIENT_CLINIC_OR_DEPARTMENT_OTHER): Payer: Self-pay | Admitting: Plastic Surgery

## 2024-02-26 LAB — SURGICAL PATHOLOGY
